# Patient Record
Sex: Female | Born: 1955 | Race: White | Hispanic: No | Marital: Single | Smoking: Former smoker
Health system: Southern US, Community
[De-identification: ages and names within clinical notes are randomized; demographics above are authoritative.]

## PROBLEM LIST (undated history)

## (undated) DIAGNOSIS — F329 Major depressive disorder, single episode, unspecified: Secondary | ICD-10-CM

## (undated) DIAGNOSIS — E119 Type 2 diabetes mellitus without complications: Secondary | ICD-10-CM

## (undated) DIAGNOSIS — F419 Anxiety disorder, unspecified: Secondary | ICD-10-CM

## (undated) DIAGNOSIS — H269 Unspecified cataract: Secondary | ICD-10-CM

## (undated) DIAGNOSIS — M1990 Unspecified osteoarthritis, unspecified site: Secondary | ICD-10-CM

## (undated) DIAGNOSIS — J45909 Unspecified asthma, uncomplicated: Secondary | ICD-10-CM

## (undated) DIAGNOSIS — J302 Other seasonal allergic rhinitis: Secondary | ICD-10-CM

## (undated) DIAGNOSIS — E785 Hyperlipidemia, unspecified: Secondary | ICD-10-CM

## (undated) DIAGNOSIS — F32A Depression, unspecified: Secondary | ICD-10-CM

## (undated) HISTORY — DX: Major depressive disorder, single episode, unspecified: F32.9

## (undated) HISTORY — DX: Depression, unspecified: F32.A

## (undated) HISTORY — DX: Unspecified osteoarthritis, unspecified site: M19.90

## (undated) HISTORY — PX: CATARACT EXTRACTION, BILATERAL: SHX1313

## (undated) HISTORY — DX: Other seasonal allergic rhinitis: J30.2

## (undated) HISTORY — DX: Anxiety disorder, unspecified: F41.9

## (undated) HISTORY — DX: Unspecified asthma, uncomplicated: J45.909

## (undated) HISTORY — DX: Unspecified cataract: H26.9

## (undated) HISTORY — DX: Hyperlipidemia, unspecified: E78.5

## (undated) HISTORY — DX: Type 2 diabetes mellitus without complications: E11.9

---

## 1989-02-12 HISTORY — PX: OTHER SURGICAL HISTORY: SHX169

## 1997-09-01 ENCOUNTER — Other Ambulatory Visit: Admission: RE | Admit: 1997-09-01 | Discharge: 1997-09-01 | Payer: Self-pay | Admitting: Obstetrics and Gynecology

## 2001-04-10 ENCOUNTER — Other Ambulatory Visit: Admission: RE | Admit: 2001-04-10 | Discharge: 2001-04-10 | Payer: Self-pay | Admitting: Obstetrics and Gynecology

## 2002-05-13 ENCOUNTER — Other Ambulatory Visit: Admission: RE | Admit: 2002-05-13 | Discharge: 2002-05-13 | Payer: Self-pay | Admitting: Obstetrics and Gynecology

## 2003-05-27 ENCOUNTER — Other Ambulatory Visit: Admission: RE | Admit: 2003-05-27 | Discharge: 2003-05-27 | Payer: Self-pay | Admitting: Obstetrics and Gynecology

## 2004-09-14 ENCOUNTER — Other Ambulatory Visit: Admission: RE | Admit: 2004-09-14 | Discharge: 2004-09-14 | Payer: Self-pay | Admitting: Obstetrics and Gynecology

## 2005-12-26 ENCOUNTER — Ambulatory Visit: Payer: Self-pay | Admitting: Internal Medicine

## 2006-02-13 ENCOUNTER — Ambulatory Visit: Payer: Self-pay | Admitting: Internal Medicine

## 2007-02-13 HISTORY — PX: BLEPHAROPLASTY: SUR158

## 2010-02-23 ENCOUNTER — Encounter: Payer: Self-pay | Admitting: Cardiology

## 2010-03-03 DIAGNOSIS — I10 Essential (primary) hypertension: Secondary | ICD-10-CM | POA: Insufficient documentation

## 2010-03-03 DIAGNOSIS — M949 Disorder of cartilage, unspecified: Secondary | ICD-10-CM

## 2010-03-03 DIAGNOSIS — R87619 Unspecified abnormal cytological findings in specimens from cervix uteri: Secondary | ICD-10-CM | POA: Insufficient documentation

## 2010-03-03 DIAGNOSIS — R5381 Other malaise: Secondary | ICD-10-CM | POA: Insufficient documentation

## 2010-03-03 DIAGNOSIS — E119 Type 2 diabetes mellitus without complications: Secondary | ICD-10-CM | POA: Insufficient documentation

## 2010-03-03 DIAGNOSIS — E7800 Pure hypercholesterolemia, unspecified: Secondary | ICD-10-CM | POA: Insufficient documentation

## 2010-03-03 DIAGNOSIS — R5383 Other fatigue: Secondary | ICD-10-CM

## 2010-03-03 DIAGNOSIS — N951 Menopausal and female climacteric states: Secondary | ICD-10-CM | POA: Insufficient documentation

## 2010-03-03 DIAGNOSIS — M899 Disorder of bone, unspecified: Secondary | ICD-10-CM | POA: Insufficient documentation

## 2010-03-06 ENCOUNTER — Encounter: Payer: Self-pay | Admitting: Cardiology

## 2010-03-06 ENCOUNTER — Ambulatory Visit
Admission: RE | Admit: 2010-03-06 | Discharge: 2010-03-06 | Payer: Self-pay | Source: Home / Self Care | Attending: Cardiology | Admitting: Cardiology

## 2010-03-16 NOTE — Assessment & Plan Note (Signed)
Summary: np4/fam hx of heart dx/htn/lg   Visit Type:  Initial Consult Primary Provider:  Dr. Leandrew Koyanagi [facility] in [place]  CC:  light headed periodically. no other complaints today .  History of Present Illness: Mrs Bradstreet is a delightful 55 year old married white female who comes today for cardiac risk factor assessment.  She has a family history of coronary disease with father having a heart attack at age 59 and dying. He was a smoker and had I. cholesterol. In addition she smoked up until 1985 starting as a teenager.  She was diagnosed with diabetes 3 years ago. Her hemoglobin A1c during runs in the sixes.  She has hyperlipidemia and is currently on 5 mg of Crestor. She says her LDL is not below 100 but I do not have any values. She says her HDL is rather good.  There is no history of hypertension.  Current Medications (verified): 1)  Lovaza 1 Gm Caps (Omega-3-Acid Ethyl Esters) .... 4 Caps Once Daily 2)  Lisinopril 5 Mg Tabs (Lisinopril) .Marland Kitchen.. 1 Tab Once Daily 3)  Celexa 20 Mg Tabs (Citalopram Hydrobromide) .Marland Kitchen.. 1 Tab Once Daily 4)  Ambien 10 Mg Tabs (Zolpidem Tartrate) .Marland Kitchen.. 1 Tab At Bedtime 5)  Crestor 5 Mg Tabs (Rosuvastatin Calcium) .Marland Kitchen.. 1 Tab At Bedtime 6)  Cod Liver Oil  Caps (Cod Liver Oil) .... 2 Caps Once Daily 7)  Metformin Hcl 500 Mg Tabs (Metformin Hcl) .Marland Kitchen.. 1 Tab Two Times A Day 8)  Allegra Allergy 180 Mg Tabs (Fexofenadine Hcl) .Marland Kitchen.. 1 Tab Once Daily 9)  Aspirin 81 Mg Tbec (Aspirin) .... Take One Tablet By Mouth Daily 10)  Calcium-Vitamin D 600-200 Mg-Unit Tabs (Calcium-Vitamin D) .... Two Times A Day  Allergies (verified): No Known Drug Allergies  Past History:  Past Medical History: Last updated: 03/03/2010 CORONARY ARTERY DISEASE, FAMILY HX (ICD-V17.3) HYPERTENSION (ICD-401.9) HYPERCHOLESTEROLEMIA (ICD-272.0) FATIGUE (ICD-780.79) DM (ICD-250.00) MENOPAUSE-RELATED VASOMOTOR SYMPTOMS, HOT FLASHES (ICD-627.2) DIABETES MELLITUS, TYPE II  (ICD-250.00) PAP SMEAR, ABNORMAL (ICD-795.00) OSTEOPENIA (ICD-733.90)    Past Surgical History: Last updated: 03/03/2010 Back Surgery...1991 Cataract surgery...right eye...2009 Colposcopy...1996? Blepharoplasty.Marland KitchenMarland Kitchen2010  Family History: Last updated: 03/03/2010 Family History of Coronary Artery Disease:  Family History of Diabetes:   Social History: Last updated: 03/03/2010 Tobacco Use - No.  Alcohol Use - yes...2-3 drinks on the weekend (wine) Drug Use - no Married   Risk Factors: Smoking Status: never (03/03/2010)  Review of Systems       negative other than history of present illness  Vital Signs:  Patient profile:   55 year old female Height:      66 inches Weight:      149.50 pounds BMI:     24.22 Pulse rate:   65 / minute Pulse rhythm:   regular BP sitting:   90 / 72  (left arm) Cuff size:   large  Vitals Entered By: Danielle Rankin, CMA (March 06, 2010 4:14 PM)  Physical Exam  General:  Well developed, well nourished, in no acute distress. Head:  normocephalic and atraumatic Eyes:  PERRLA/EOM intact; conjunctiva and lids normal. Neck:  Neck supple, no JVD. No masses, thyromegaly or abnormal cervical nodes. Chest Kailany Dinunzio:  no deformities or breast masses noted Lungs:  Clear bilaterally to auscultation and percussion. Heart:  PMI not displaced, normal S1-S2, no murmur rub or gallop. No carotid bruits Abdomen:  cough, good bowel sounds, no bruit Msk:  Back normal, normal gait. Muscle strength and tone normal. Pulses:  pulses normal in all 4 extremities  Extremities:  No clubbing or cyanosis. Neurologic:  Alert and oriented x 3. Skin:  Intact without lesions or rashes. Psych:  Normal affect.   Impression & Recommendations:  Problem # 1:  HYPERCHOLESTEROLEMIA (ICD-272.0) I advised her to increase her Crestor alone with her primary care to achieve a values close to 70 for her LDL. Her updated medication list for this problem includes:    Lovaza 1 Gm Caps  (Omega-3-acid ethyl esters) .Marland KitchenMarland KitchenMarland KitchenMarland Kitchen 4 caps once daily    Crestor 5 Mg Tabs (Rosuvastatin calcium) .Marland Kitchen... 1 tab at bedtime  Problem # 2:  DM (ICD-250.00) continued excellent control Her updated medication list for this problem includes:    Lisinopril 5 Mg Tabs (Lisinopril) .Marland Kitchen... 1 tab once daily    Metformin Hcl 500 Mg Tabs (Metformin hcl) .Marland Kitchen... 1 tab two times a day    Aspirin 81 Mg Tbec (Aspirin) .Marland Kitchen... Take one tablet by mouth daily  Problem # 3:  CORONARY ARTERY DISEASE, FAMILY HX (ICD-V17.3) Assessment: Unchanged  Other Orders: EKG w/ Interpretation (93000)  Patient Instructions: 1)  Your physician recommends that you schedule a follow-up appointment as needed. 2)  Dr. Daleen Squibb recommends that you have your primary care doctor increase Crestor to get LDL as close to 70 as possible. 3)  Please walk 3 hours per week for exercise.:

## 2010-04-11 NOTE — Letter (Signed)
Summary: [facility]  [facility]   Imported By: Marylou Mccoy 04/05/2010 14:28:08  _____________________________________________________________________  External Attachment:    Type:   Image     Comment:   External Document

## 2010-06-30 NOTE — Assessment & Plan Note (Signed)
[facility]                         GASTROENTEROLOGY OFFICE NOTE   ELANORA, QUIN                     MRN:          [id number]  DATE:02/13/2006                            DOB:          1955-02-14    Ms. Hattery returns for followup of anal fissure.  Since her appointment  of December 26, 2005, her rectal bleeding stopped as a result of using  Anusol HC suppositories for almost 2 weeks on a daily basis combined  with AnaMantle cream 2.5% twice or three times per day.  She has done  very well and seems to be satisfied with the progress.   Today I have given her a new prescription for Anusol HC suppositories  which she feels helps more than the cream to be used as a maintenance.  In case the anal fissure recurs I would suggest surgical consultation  for repair of the anal fissure.  She will let me know if that is the  case.     Kendra [name]. Juanda Chance, MD  Electronically Signed    DMB/MedQ  DD: 02/13/2006  DT: 02/13/2006  Job #: [id number]   cc:   Juluis Mire, M.D.

## 2010-06-30 NOTE — Assessment & Plan Note (Signed)
[facility]                           GASTROENTEROLOGY OFFICE NOTE   Kendra [name], Kendra [name]                     MRN:          [id number]  DATE:12/26/2005                            DOB:          10-29-1955    Kendra [name] is a 55 year old white female who is here today because of acute  hematochezia.  This was occurring every day for about 3 weeks.  We have sent  the patient topical steroids and Anusol-HC suppositories.  The bleeding  subsided but returned again 2 days ago.  There was a large amount of blood  per rectum in the toilet.  We did a colonoscopy on August 12, 2003, because of  a family history of colon polyps.  Exam was normal except for external  hemorrhoids.  She said she has had hemorrhoids for many years.   PHYSICAL EXAMINATION:  VITAL SIGNS:  Blood pressure 104/70, pulse 92, and  weight 153 pounds.  ABDOMEN:  Unremarkable.  CHEST:  Clear to auscultation.  CARDIAC:  Normal S1 and S2.  RECTAL:  Rectal exam and anoscopic exam shows external hemorrhoidal tag.  Anal canal was normal.  There was a 5 mm long x 3 mm wide anal fissure at  the junction of the dentate line.  There was prominent papilla at the  dentate line which was polypoid and might have represented a thrombosed  scarred internal hemorrhoid.  The anal fissure was bleeding as the anoscope  advanced through the anal canal.  The fissure was oozing a small amount of  bright red blood.  Stool itself in the rectal ampulla was heme-negative.   IMPRESSION:  Anal fissure, bleeding.   PLAN:  Resume conservative measures with AnaMantle Cream 2.5% twice or three  times a day, Anusol-HC suppositories at bedtime.  Refrain from straining.  Take a high-fiber diet.  I will see her again in 4 weeks.  If symptoms  continue, she will need surgical consult for repair of anal fissure.  We are  going to check a CBC today.     Kendra [name]. Kendra Chance, MD  Electronically Signed    DMB/MedQ  DD:  12/26/2005  DT: 12/26/2005  Job #: [id number]   cc:   Kendra [name], M.D.

## 2013-05-06 ENCOUNTER — Encounter: Payer: Self-pay | Admitting: Internal Medicine

## 2013-10-14 ENCOUNTER — Encounter: Payer: Self-pay | Admitting: Internal Medicine

## 2013-12-14 ENCOUNTER — Encounter: Payer: Self-pay | Admitting: Internal Medicine

## 2013-12-17 ENCOUNTER — Ambulatory Visit (AMBULATORY_SURGERY_CENTER): Payer: Self-pay | Admitting: *Deleted

## 2013-12-17 VITALS — Ht 65.5 in | Wt 150.4 lb

## 2013-12-17 DIAGNOSIS — Z1211 Encounter for screening for malignant neoplasm of colon: Secondary | ICD-10-CM

## 2013-12-17 MED ORDER — MOVIPREP 100 G PO SOLR: 1.0000 | Freq: Once | ORAL | Status: DC

## 2013-12-17 NOTE — Progress Notes (Signed)
Denies allergies to eggs or soy products. Denies complications with sedation or anesthesia. Denies O2 use. Denies use of diet or weight loss medications.  Emmi instructions given for colonoscopy.  

## 2013-12-31 ENCOUNTER — Ambulatory Visit (AMBULATORY_SURGERY_CENTER): Payer: Managed Care, Other (non HMO) | Admitting: Internal Medicine

## 2013-12-31 ENCOUNTER — Encounter: Payer: Self-pay | Admitting: Internal Medicine

## 2013-12-31 VITALS — BP 101/71 | HR 58 | Temp 97.2°F | Resp 14 | Ht 65.5 in | Wt 150.0 lb

## 2013-12-31 DIAGNOSIS — Z1211 Encounter for screening for malignant neoplasm of colon: Secondary | ICD-10-CM

## 2013-12-31 MED ORDER — SODIUM CHLORIDE 0.9 % IV SOLN: 500.0000 mL | INTRAVENOUS | Status: DC

## 2013-12-31 NOTE — Op Note (Signed)
[facility] [address] [place], [id number]   COLONOSCOPY PROCEDURE REPORT  PATIENT: Kendra [name], Kendra S  MR#: [id number] BIRTHDATE: [birth year] , 58  yrs. old GENDER: female ENDOSCOPIST: Hart Carwinora M Irisa Grimsley, MD REFERRED BY: Dr Quintin Altosteven Burdine PROCEDURE DATE:  12/31/2013 PROCEDURE:   Colonoscopy, screening First Screening Colonoscopy - Avg.  risk and is 50 yrs.  old or older - No.  Prior Negative Screening - Now for repeat screening. 10 or more years since last screening  History of Adenoma - Now for follow-up colonoscopy & has been > or = to 3 yrs.  N/A  Polyps Removed Today? No.  Polyps Removed Today? No.  Recommend repeat exam, <10 yrs? Polyps Removed Today? No.  Recommend repeat exam, <10 yrs? No. ASA CLASS:   Class I INDICATIONS:average risk for colon cancer and prior colonoscopy in 07/2003 was normal. family hx of colon polyps MEDICATIONS: Monitored anesthesia care and Propofol 400 mg IV  DESCRIPTION OF PROCEDURE:   After the risks benefits and alternatives of the procedure were thoroughly explained, informed consent was obtained.  The digital rectal exam revealed no abnormalities of the rectum.   The LB [id number]-[id number]  endoscope was introduced through the anus and advanced to the cecum, which was identified by the ileocecal valve. No adverse events experienced.   The quality of the prep was excellent, using MoviPrep  The instrument was then slowly withdrawn as the colon was fully examined.      COLON FINDINGS: Small internal Grade I hemorrhoids were found. Retroflexed views revealed no abnormalities. The time to cecum=26 minutes 46 seconds.  Withdrawal time=6 minutes 02 seconds.  The scope was withdrawn and the procedure completed. COMPLICATIONS: There were no immediate complications.  ENDOSCOPIC IMPRESSION: Small internal Grade I hemorrhoids  RECOMMENDATIONS: High fiber diet recall colonoscopy in 10 years  eSigned:  Hart Carwinora M Golda Zavalza, MD 12/31/2013  10:09 AM   cc:

## 2013-12-31 NOTE — Progress Notes (Signed)
Report to PACU, RN, vss, BBS= Clear.  

## 2013-12-31 NOTE — Patient Instructions (Addendum)

## 2014-01-01 ENCOUNTER — Telehealth: Payer: Self-pay

## 2014-01-01 NOTE — Telephone Encounter (Signed)
  Follow up Call-  Call back number 12/31/2013  Post procedure Call Back phone  # [phone]  Permission to leave phone message Yes     Patient questions:  Do you have a fever, pain , or abdominal swelling? No. Pain Score  0 *  Have you tolerated food without any problems? Yes.    Have you been able to return to your normal activities? Yes.    Do you have any questions about your discharge instructions: Diet   No. Medications  No. Follow up visit  No.  Do you have questions or concerns about your Care? No.  Actions: * If pain score is 4 or above: No action needed, pain <4.

## 2016-04-26 ENCOUNTER — Other Ambulatory Visit: Payer: Self-pay | Admitting: Obstetrics and Gynecology

## 2016-04-26 DIAGNOSIS — M79622 Pain in left upper arm: Secondary | ICD-10-CM

## 2016-05-01 ENCOUNTER — Ambulatory Visit
Admission: RE | Admit: 2016-05-01 | Discharge: 2016-05-01 | Disposition: A | Discharge status: Home / Self Care | Payer: Managed Care, Other (non HMO) | Source: Ambulatory Visit | Attending: Obstetrics and Gynecology | Admitting: Obstetrics and Gynecology

## 2016-05-01 DIAGNOSIS — M79622 Pain in left upper arm: Secondary | ICD-10-CM

## 2016-12-11 DIAGNOSIS — F411 Generalized anxiety disorder: Secondary | ICD-10-CM | POA: Diagnosis not present

## 2016-12-11 DIAGNOSIS — E782 Mixed hyperlipidemia: Secondary | ICD-10-CM | POA: Diagnosis not present

## 2016-12-11 DIAGNOSIS — E783 Hyperchylomicronemia: Secondary | ICD-10-CM | POA: Diagnosis not present

## 2016-12-11 DIAGNOSIS — E119 Type 2 diabetes mellitus without complications: Secondary | ICD-10-CM | POA: Diagnosis not present

## 2016-12-20 DIAGNOSIS — Z23 Encounter for immunization: Secondary | ICD-10-CM | POA: Diagnosis not present

## 2016-12-20 DIAGNOSIS — Z0001 Encounter for general adult medical examination with abnormal findings: Secondary | ICD-10-CM | POA: Diagnosis not present

## 2016-12-20 DIAGNOSIS — Z6823 Body mass index (BMI) 23.0-23.9, adult: Secondary | ICD-10-CM | POA: Diagnosis not present

## 2017-02-12 HISTORY — PX: BACK SURGERY: SHX140

## 2017-04-04 DIAGNOSIS — M545 Low back pain: Secondary | ICD-10-CM | POA: Diagnosis not present

## 2017-04-04 DIAGNOSIS — M25551 Pain in right hip: Secondary | ICD-10-CM | POA: Diagnosis not present

## 2017-04-07 ENCOUNTER — Emergency Department (HOSPITAL_COMMUNITY): Payer: BLUE CROSS/BLUE SHIELD

## 2017-04-07 ENCOUNTER — Emergency Department (HOSPITAL_COMMUNITY)
Admission: EM | Admit: 2017-04-07 | Discharge: 2017-04-08 | Disposition: A | Discharge status: Home / Self Care | Payer: BLUE CROSS/BLUE SHIELD | Attending: Emergency Medicine | Admitting: Emergency Medicine

## 2017-04-07 DIAGNOSIS — M25551 Pain in right hip: Secondary | ICD-10-CM

## 2017-04-07 DIAGNOSIS — I10 Essential (primary) hypertension: Secondary | ICD-10-CM | POA: Diagnosis not present

## 2017-04-07 DIAGNOSIS — Z79899 Other long term (current) drug therapy: Secondary | ICD-10-CM | POA: Insufficient documentation

## 2017-04-07 DIAGNOSIS — M5416 Radiculopathy, lumbar region: Secondary | ICD-10-CM | POA: Insufficient documentation

## 2017-04-07 DIAGNOSIS — Z87891 Personal history of nicotine dependence: Secondary | ICD-10-CM | POA: Diagnosis not present

## 2017-04-07 DIAGNOSIS — M545 Low back pain, unspecified: Secondary | ICD-10-CM

## 2017-04-07 DIAGNOSIS — J45909 Unspecified asthma, uncomplicated: Secondary | ICD-10-CM | POA: Insufficient documentation

## 2017-04-07 DIAGNOSIS — M549 Dorsalgia, unspecified: Secondary | ICD-10-CM | POA: Diagnosis not present

## 2017-04-07 DIAGNOSIS — M79604 Pain in right leg: Secondary | ICD-10-CM | POA: Diagnosis not present

## 2017-04-07 DIAGNOSIS — E119 Type 2 diabetes mellitus without complications: Secondary | ICD-10-CM | POA: Diagnosis not present

## 2017-04-07 MED ORDER — NAPROXEN 500 MG PO TABS: 500.0000 mg | ORAL_TABLET | Freq: Three times a day (TID) | ORAL | 0 refills | Status: AC

## 2017-04-07 MED ORDER — ACETAMINOPHEN 500 MG PO TABS
1000.0000 mg | ORAL_TABLET | Freq: Once | ORAL | Status: AC
  Administered 2017-04-07: 1000 mg via ORAL
  Filled 2017-04-07: qty 2

## 2017-04-07 MED ORDER — OXYCODONE-ACETAMINOPHEN 5-325 MG PO TABS: 2.0000 | ORAL_TABLET | ORAL | 0 refills | Status: AC | PRN

## 2017-04-07 MED ORDER — OXYCODONE-ACETAMINOPHEN 5-325 MG PO TABS
1.0000 | ORAL_TABLET | ORAL | Status: DC | PRN
  Administered 2017-04-07: 1 via ORAL
  Filled 2017-04-07: qty 1

## 2017-04-07 MED ORDER — NAPROXEN 250 MG PO TABS
500.0000 mg | ORAL_TABLET | Freq: Once | ORAL | Status: AC
  Administered 2017-04-07: 500 mg via ORAL
  Filled 2017-04-07: qty 2

## 2017-04-07 MED ORDER — HYDROMORPHONE HCL 1 MG/ML IJ SOLN
1.0000 mg | Freq: Once | INTRAMUSCULAR | Status: AC
  Administered 2017-04-07: 1 mg via INTRAMUSCULAR
  Filled 2017-04-07: qty 1

## 2017-04-07 NOTE — ED Provider Notes (Addendum)
Patient care signed out to me from Sharen Hecklaudia Gibbons, PA-C, at end of shift. She is here for evaluation of right hip pain for the past 6 days without known injury. She was seen by her doctor 4 days ago and reports negative plain film imaging. Pain continues. She continues to be weight bearing and active but with pain. No fever.   Patient signed out with CT of the hip pending. Pain is addressed in the ED.   Plan: review CT of hip. If negative, she can be discharged home with pain control. ? Ortho follow up.   CT is negative for fracture or effusion. On re-evaluation she reports severe pain in anterior thigh and knee. Mild pain in hip. Little relief with Percocet given here.   Further discussion with the patient finds that she has had previous lumbar surgery remotely. She denies severe back pain but reports some. No urinary/bowel dysfunction, numbness or weakness of the right LE. She feels walking is extremely difficult due to pain, even with cane. Pain seems radicular following L3 pattern.   She reports she is uncomfortable with discharge home. Due to severity of pain will obtain MRI of lumbar spine. Discussed that unless there was a neurologic emergency identified, the plan would be for outpatient follow up. Additional pain management ordered.   Patient's MRI delayed due to multiple MRI orders in the department causing increased wait time. Patient care signed out to Kendra Drapeob Browning, PA-C pending MRI results. Anticipate discharge home.    Kendra [name], Kendra Uhlig, PA-C 04/07/17 1709      Kendra [name], Kendra Cappello, PA-C 04/08/17 Kendra [name]    Kendra [name], Kevin, MD 04/12/17 2235

## 2017-04-07 NOTE — ED Notes (Signed)
Advised pt MRI said it wouldn't be before 1am

## 2017-04-07 NOTE — ED Notes (Addendum)
Pt stable, ambulatory with difficulty, states understanding of discharge instructions but requesting to stay for MRI

## 2017-04-07 NOTE — Discharge Instructions (Addendum)
CT scan negative.   Take 500 mg tylenol plus 500 mg naproxen every 6 hours for mild or moderate pain. You may take 1 percocet for more severe break through pain. Follow up with your primary care doctor as soon as possible for pain control and referrals.   Percocet is a narcotic pain medication that has risk of overdose, death, dependence and abuse. Mild and expected side effects include nausea, stomach upset, drowsiness, constipation. Do not consume alcohol, drive or use heavy machinery while taking this medication. Do not leave unattended around children. Flush any remaining pills that you do not use and do not share.  The emergency department has a strict policy regarding prescription of narcotic medications. We prescribe a short course for acute, new pain or injuries. We are unable to refill this medication in the emergency department for chronic pain or repeatedly.  Refill need to be done by specialist or primary care provider or pain clinic.  Contact your primary care provider or specialist for chronic pain management and refill on narcotic medications.

## 2017-04-07 NOTE — ED Provider Notes (Addendum)
[facility] EMERGENCY DEPARTMENT Provider Note   CSN: [id number] Arrival date & time: 04/07/17  1335     History   Chief Complaint Chief Complaint  Patient presents with  . Hip Pain    HPI Kendra [name] is a 62 y.o. female Is here for evaluation of gradually worsening pain to the middle and right side of her back as well as the front of her right leg down to the knee for 6 days. Describes pain as a pulling sensation that is worse with weight bearing. States she had a bad fall 2 months ago while she was walking in the snow, she fellon the cement curb and landed directly on her right hip. She was sore for a few days but eventually this improved. Since she has been very active without any difficulty. Pain was sudden, gradually worsening. It is described as a 10/10 with weightbearing. Went to see her primary care doctor for this 3 days ago who did x-rays of her hip and back that she was told was normal however was recommended she got an MRI to rule out a small fracture. States her insurance has been giving her problems to schedule this procedure and she has also been unable to obtain her tramadol. Has been taking her husband's tramadol and naproxen that have been helping. States she had a bad back injury in her 30s and had a back surgery, since then she has had decreased sensation to the posterior aspect of her right leg, this is not new and unchanged. She denies fevers, chills, nausea, vomiting, abdominal pain, urinary symptoms, hematuria, history of kidney stones, saddle anesthesia, bladder or bowel incontinence or retention.  HPI  Past Medical History:  Diagnosis Date  . Anxiety   . Arthritis   . Asthma   . Cataracts, bilateral   . Depression   . Diabetes type 2, controlled (HCC)   . Hyperlipidemia   . Seasonal allergies     Patient Active Problem List   Diagnosis Date Noted  . Type II or unspecified type diabetes mellitus without mention of complication, not  stated as uncontrolled 03/03/2010  . HYPERCHOLESTEROLEMIA 03/03/2010  . HYPERTENSION 03/03/2010  . MENOPAUSE-RELATED VASOMOTOR SYMPTOMS, HOT FLASHES 03/03/2010  . OSTEOPENIA 03/03/2010  . FATIGUE 03/03/2010  . PAP SMEAR, ABNORMAL 03/03/2010    Past Surgical History:  Procedure Laterality Date  . BLEPHAROPLASTY  2009  . CATARACT EXTRACTION, BILATERAL  2011, 2013  . lumbar disc removal  1991    OB History    No data available       Home Medications    Prior to Admission medications   Medication Sig Start Date End Date Taking? Authorizing Provider  Ascorbic Acid (VITAMIN C) 500 MG CAPS Take by mouth.    [provider]  calcium-vitamin D (OSCAL) 250-125 MG-UNIT per tablet Take by mouth.    [provider]  Coenzyme Q10 (CO Q 10 PO) Take by mouth.    [provider]  fexofenadine (ALLEGRA) 180 MG tablet Take 180 mg by mouth.    [provider]  gemfibrozil (LOPID) 600 MG tablet Take 600 mg by mouth 2 (two) times daily before a meal.    [provider]  lisinopril (PRINIVIL,ZESTRIL) 5 MG tablet Take 5 mg by mouth.    [provider]  metFORMIN (GLUCOPHAGE) 500 MG tablet Take 500 mg by mouth.    [provider]  naproxen (NAPROSYN) 500 MG tablet Take 1 tablet (500 mg total)  by mouth 3 (three) times daily with meals for 5 days. 04/07/17 04/12/17  Liberty Handy, PA-C  omega-3 acid ethyl esters (LOVAZA) 1 G capsule Take 2 g by mouth.    [provider]  oxyCODONE-acetaminophen (PERCOCET/ROXICET) 5-325 MG tablet Take 2 tablets by mouth every 4 (four) hours as needed for up to 3 days for severe pain. 04/07/17 04/10/17  Liberty Handy, PA-C  rosuvastatin (CRESTOR) 10 MG tablet Take 10 mg by mouth.    [provider]  sertraline (ZOLOFT) 100 MG tablet Take 100 mg by mouth daily.    [provider]  vitamin B-12 (CYANOCOBALAMIN) 1000 MCG tablet Take 1,000 mcg by mouth.    [provider]    zolpidem (AMBIEN) 10 MG tablet Take 10 mg by mouth at bedtime as needed for sleep.    [provider]    Family History Family History  Problem Relation Age of Onset  . Breast cancer Paternal Grandmother   . Colon cancer Neg Hx   . Esophageal cancer Neg Hx   . Rectal cancer Neg Hx   . Prostate cancer Neg Hx     Social History Social History   Tobacco Use  . Smoking status: Former Smoker    Last attempt to quit: 02/12/1981    Years since quitting: 36.1  . Smokeless tobacco: Never Used  Substance Use Topics  . Alcohol use: Yes    Alcohol/week: 0.0 oz    Comment: rare  . Drug use: No     Allergies   Patient has no known allergies.   Review of Systems Review of Systems  Musculoskeletal: Positive for arthralgias and back pain.     Physical Exam Updated Vital Signs BP 109/70 (BP Location: Right Arm)   Pulse 70   Temp 99 F (37.2 C) (Oral)   Resp 16   Ht 5\' 5"  (1.651 m)   Wt 64.9 kg (143 lb)   SpO2 100%   BMI 23.80 kg/m   Physical Exam  Constitutional: She is oriented to person, place, and time. She appears well-developed and well-nourished. No distress.  HENT:  Head: Normocephalic and atraumatic.  Nose: Nose normal.  Eyes: EOM are normal.  Neck:  Full AROM of cervical spine without pain or rigidity  Cardiovascular: Normal rate, S1 normal, S2 normal and normal heart sounds.  Pulses:      Radial pulses are 2+ on the right side, and 2+ on the left side.       Dorsalis pedis pulses are 2+ on the right side, and 2+ on the left side.  Pulmonary/Chest: Effort normal and breath sounds normal. She has no decreased breath sounds.  Abdominal: Soft. Normal appearance and bowel sounds are normal. There is no tenderness.  No suprapubic or CVA tenderness   Musculoskeletal:       Lumbar back: She exhibits pain.  No midline TL or sacral tenderness or step offs. Old midline lumbar spine scar well healed.  No TL spine paraspinal muscular tenderness or increased  tone  SI joints and sciatic notches non tender, bilaterally  Negative SLR. Negative Faber. Negative Stinchfield test. No leg shortening or rotation. Pelvis stable.  +Right hip pain with weight bearing and deep internal rotation   Neurological: She is alert and oriented to person, place, and time. A sensory deficit is present.  5/5 strength with flexion/extension of hip, knee and ankle, bilaterally.  Decreased sensation to RLE that pt states is chronic.  Knee DTRs symmetric. Normal plantar  reflex.   Skin: Skin is warm and dry. Capillary refill takes less than 2 seconds.  Psychiatric: She has a normal mood and affect. Her speech is normal and behavior is normal. Judgment and thought content normal. Cognition and memory are normal.  Nursing note and vitals reviewed.    ED Treatments / Results  Labs (all labs ordered are listed, but only abnormal results are displayed) Labs Reviewed - No data to display  EKG  EKG Interpretation None       Radiology No results found.  Procedures Procedures (including critical care time)  Medications Ordered in ED Medications  oxyCODONE-acetaminophen (PERCOCET/ROXICET) 5-325 MG per tablet 1 tablet (1 tablet Oral Given 04/07/17 1401)  acetaminophen (TYLENOL) tablet 1,000 mg (1,000 mg Oral Given 04/07/17 1528)  naproxen (NAPROSYN) tablet 500 mg (500 mg Oral Given 04/07/17 1528)     Initial Impression / Assessment and Plan / ED Course  I have reviewed the triage vital signs and the nursing notes.  Pertinent labs & imaging results that were available during my care of the patient were reviewed by me and considered in my medical decision making (see chart for details).    62 yo here with right anterior leg pain, right buttock pain and low back pain. Atraumatic. One fall 2 months ago. No danger signs of back pain. She has chronic right leg numbness from surgery in her 30s for back injury, not new or worsening. Given age, risk, remote h/o of fall will  get CT to r/o smaller fracture. She had normal x-rays at PCP 3 days ago. Pain controlled with oral medications. Will hand off to oncoming EDP who will f/u on CT results. If negative, pt to be discharged with percocet and f/u with PCP for MRI and ortho referral. Pt discussed with Dr. Denton LankSteinl. Plan discussed with pt and family at bedside they are agreeable.   Final Clinical Impressions(s) / ED Diagnoses   Final diagnoses:  Pain of right hip joint  Acute right-sided low back pain without sciatica    ED Discharge Orders        Ordered    oxyCODONE-acetaminophen (PERCOCET/ROXICET) 5-325 MG tablet  Every 4 hours PRN     04/07/17 1549    naproxen (NAPROSYN) 500 MG tablet  3 times daily with meals     04/07/17 1549         Jerrell MylarGibbons, Clora Ohmer J, PA-C 04/07/17 1555    Cathren LaineSteinl, Kevin, MD 04/12/17 2235

## 2017-04-07 NOTE — ED Notes (Signed)
This RN not aware that pt was roomed to hallway bed and was waiting for transport

## 2017-04-07 NOTE — ED Triage Notes (Signed)
Pt to ER for further evaluation of right anterior leg and right hip pain, pt reports significant back hx, began having this pain Monday, was seen by her doctor Thursday and had films done. Was told she needs an MRI, was given pain med scripts but unable to fill them due to insurance issue. Pt here for pain control.

## 2017-04-08 ENCOUNTER — Emergency Department (HOSPITAL_COMMUNITY): Payer: BLUE CROSS/BLUE SHIELD

## 2017-04-08 DIAGNOSIS — M79604 Pain in right leg: Secondary | ICD-10-CM | POA: Diagnosis not present

## 2017-04-08 MED ORDER — CYCLOBENZAPRINE HCL 10 MG PO TABS: 10.0000 mg | ORAL_TABLET | Freq: Two times a day (BID) | ORAL | 0 refills | Status: DC | PRN

## 2017-04-08 MED ORDER — HYDROMORPHONE HCL 1 MG/ML IJ SOLN
1.0000 mg | Freq: Once | INTRAMUSCULAR | Status: AC
  Administered 2017-04-08: 1 mg via INTRAMUSCULAR
  Filled 2017-04-08: qty 1

## 2017-04-08 NOTE — ED Provider Notes (Signed)
Pain improved.  MRI as below.  Disc protrusion probable cause of pain.  Neurosurg follow-up.  No results found for this or any previous visit. Ct Lumbar Spine Wo Contrast  Result Date: 04/07/2017 CLINICAL DATA:  Back pain and right leg pain. EXAM: CT LUMBAR SPINE WITHOUT CONTRAST TECHNIQUE: Multidetector CT imaging of the lumbar spine was performed without intravenous contrast administration. Multiplanar CT image reconstructions were also generated. COMPARISON:  None. FINDINGS: Segmentation: The lowest lumbar type non-rib-bearing vertebra is labeled as L5. Alignment: 2 mm grade 1 degenerative anterolisthesis at L3-4. Vertebrae: No fracture or acute bony findings. Moderate to prominent loss of articular space at L5-S1 with mild endplate irregularity and vacuum disc phenomenon in addition to intervertebral spurring. Multilevel facet arthropathy but most notable at the L3-4 level bilaterally. Paraspinal and other soft tissues: Aortoiliac atherosclerotic vascular disease. Disc levels: T11-12: No impingement, central intervertebral spur noted, image 30/8. T12-L1: Unremarkable. L1-2: Unremarkable L2-3: Mild central narrowing of the thecal sac and borderline bilateral foraminal stenosis due to disc bulge. L3-4: I am suspicious of considerable right foraminal impingement probably due to a disc protrusion given the high density and effacement of fat in the right neural foramen. There is likely mild and probably moderate central narrowing of the thecal sac at this level due to disc bulge. L4-5: Moderate central narrowing of the thecal sac and suspected mild bilateral foraminal stenosis due to disc bulge and facet arthropathy. L5-S1: Suspected moderate left and mild right foraminal stenosis due to intervertebral and facet spurring at this level. IMPRESSION: 1. Suspected considerable right foraminal impingement at L3-4 likely due to a disc protrusion. Although CT is excellent in characterizing bony impingement in the  spine, MRI is superior to CT in characterizing disc disease. 2. In addition to the suspected right L3-4 impingement, there is moderate impingement at L3-4, L4-5, and L5-S1; and mild impingement at L2-3, as detailed above. 3.  Aortic Atherosclerosis (ICD10-I70.0). Electronically Signed   By: Gaylyn RongWalter  Liebkemann M.D.   On: 04/07/2017 17:01   Mr Lumbar Spine Wo Contrast  Result Date: 04/08/2017 CLINICAL DATA:  Anterior RIGHT leg and RIGHT hip pain beginning yesterday. EXAM: MRI LUMBAR SPINE WITHOUT CONTRAST TECHNIQUE: Multiplanar, multisequence MR imaging of the lumbar spine was performed. No intravenous contrast was administered. COMPARISON:  MRI of the lumbar spine report dated December 20, 2010 though images are not available for direct comparison. FINDINGS: SEGMENTATION: For the purposes of this report, the last well-formed intervertebral disc is reported as L5-S1. ALIGNMENT: Maintained lumbar lordosis. Minimal grade 1 L3-4 anterolisthesis. VERTEBRAE:Vertebral bodies are intact. Moderate to severe L5-S1 disc height loss, minimal L2-3 and L3-4 disc height loss. Disc desiccation L2-3 through L5-S1 with multilevel mild chronic discogenic endplate changes. Bright STIR signal LEFT L3 and L4 pedicles compatible with edema. CONUS MEDULLARIS AND CAUDA EQUINA: Conus medullaris terminates at T12-L1 and demonstrates normal morphology and signal characteristics. Cauda equina is normal. PARASPINAL AND OTHER SOFT TISSUES: Included prevertebral and paraspinal soft tissues are normal. DISC LEVELS: T12-L1 and L1-2: No disc bulge, canal stenosis nor neural foraminal narrowing. L2-3: Small broad-based disc bulge. Mild facet arthropathy and ligamentum flavum redundancy. Mild canal stenosis. No neural foraminal narrowing. L3-4: Anterolisthesis. Moderate broad-based disc bulge and RIGHT extraforaminal moderate disc protrusion versus extrusion displacing the exited RIGHT L3 nerve. Moderate RIGHT greater than LEFT facet arthropathy and  ligamentum flavum redundancy. Mild canal stenosis. Severe RIGHT, moderate LEFT neural foraminal narrowing. L4-5: Small broad-based disc bulge. RIGHT central annular fissure. Mild facet arthropathy and ligamentum flavum  redundancy without canal stenosis. Moderate neural foraminal narrowing. L5-S1: Small RIGHT disc protrusion may affect the traversing RIGHT S1 nerve. Mild facet arthropathy. No canal stenosis. Mild bilateral neural foraminal narrowing. IMPRESSION: 1. RIGHT L3-4 extraforaminal disc protrusion versus extrusion resulting in exited RIGHT L3 nerve impingement. 2. Minimal grade 1 L3-4 anterolisthesis without spondylolysis. Mild LEFT L3 and L4 pedicle bone marrow edema most compatible with stress response. 3. Mild canal stenosis L2-3 and L3-4. Neural foraminal narrowing L3-4 through L5-S1: Severe on the RIGHT at L3-4. Electronically Signed   By: Awilda Metro M.D.   On: 04/08/2017 05:06   Ct Hip Right Wo Contrast  Result Date: 04/07/2017 CLINICAL DATA:  Right anterior leg pain and hip pain. EXAM: CT OF THE RIGHT HIP WITHOUT CONTRAST TECHNIQUE: Multidetector CT imaging of the right hip was performed according to the standard protocol. Multiplanar CT image reconstructions were also generated. COMPARISON:  None. FINDINGS: Bones/Joint/Cartilage Mild spurring of the right acetabulum. No fracture of the right hip or regional pelvis is identified. No hip effusion noted. Ligaments Suboptimally assessed by CT. Muscles and Tendons Unremarkable Soft tissues Mild right common femoral artery atherosclerotic calcification. IMPRESSION: 1. No appreciable fracture or hip joint effusion. 2. Degenerative spurring of the right acetabulum 3. Mild right common femoral artery atherosclerotic calcification. Electronically Signed   By: Gaylyn Rong M.D.   On: 04/07/2017 16:52      Roxy Horseman, PA-C 04/08/17 1610    Geoffery Lyons, MD 04/08/17 0600

## 2017-04-08 NOTE — ED Notes (Signed)
Patient transported to MRI 

## 2017-04-16 DIAGNOSIS — M5126 Other intervertebral disc displacement, lumbar region: Secondary | ICD-10-CM | POA: Diagnosis not present

## 2017-04-16 DIAGNOSIS — M5416 Radiculopathy, lumbar region: Secondary | ICD-10-CM | POA: Diagnosis not present

## 2017-05-02 DIAGNOSIS — M5126 Other intervertebral disc displacement, lumbar region: Secondary | ICD-10-CM | POA: Diagnosis not present

## 2017-05-02 DIAGNOSIS — M5116 Intervertebral disc disorders with radiculopathy, lumbar region: Secondary | ICD-10-CM | POA: Diagnosis not present

## 2017-06-13 DIAGNOSIS — H43813 Vitreous degeneration, bilateral: Secondary | ICD-10-CM | POA: Diagnosis not present

## 2017-06-13 DIAGNOSIS — H04123 Dry eye syndrome of bilateral lacrimal glands: Secondary | ICD-10-CM | POA: Diagnosis not present

## 2017-06-13 DIAGNOSIS — E119 Type 2 diabetes mellitus without complications: Secondary | ICD-10-CM | POA: Diagnosis not present

## 2017-06-17 DIAGNOSIS — F1010 Alcohol abuse, uncomplicated: Secondary | ICD-10-CM | POA: Diagnosis not present

## 2017-06-17 DIAGNOSIS — E783 Hyperchylomicronemia: Secondary | ICD-10-CM | POA: Diagnosis not present

## 2017-06-17 DIAGNOSIS — E119 Type 2 diabetes mellitus without complications: Secondary | ICD-10-CM | POA: Diagnosis not present

## 2017-06-17 DIAGNOSIS — E782 Mixed hyperlipidemia: Secondary | ICD-10-CM | POA: Diagnosis not present

## 2017-06-19 DIAGNOSIS — E119 Type 2 diabetes mellitus without complications: Secondary | ICD-10-CM | POA: Diagnosis not present

## 2017-06-19 DIAGNOSIS — G4709 Other insomnia: Secondary | ICD-10-CM | POA: Diagnosis not present

## 2017-06-19 DIAGNOSIS — M545 Low back pain: Secondary | ICD-10-CM | POA: Diagnosis not present

## 2017-06-19 DIAGNOSIS — E782 Mixed hyperlipidemia: Secondary | ICD-10-CM | POA: Diagnosis not present

## 2017-06-26 DIAGNOSIS — L738 Other specified follicular disorders: Secondary | ICD-10-CM | POA: Diagnosis not present

## 2017-08-01 DIAGNOSIS — Z6823 Body mass index (BMI) 23.0-23.9, adult: Secondary | ICD-10-CM | POA: Diagnosis not present

## 2017-08-01 DIAGNOSIS — Z1231 Encounter for screening mammogram for malignant neoplasm of breast: Secondary | ICD-10-CM | POA: Diagnosis not present

## 2017-08-01 DIAGNOSIS — Z01419 Encounter for gynecological examination (general) (routine) without abnormal findings: Secondary | ICD-10-CM | POA: Diagnosis not present

## 2017-08-13 ENCOUNTER — Other Ambulatory Visit: Payer: Self-pay

## 2017-08-13 ENCOUNTER — Encounter (HOSPITAL_COMMUNITY): Payer: Self-pay | Admitting: *Deleted

## 2017-08-13 ENCOUNTER — Emergency Department (HOSPITAL_COMMUNITY)
Admission: EM | Admit: 2017-08-13 | Discharge: 2017-08-14 | Disposition: A | Discharge status: Other Acute Inpatient Hospital | Payer: BLUE CROSS/BLUE SHIELD | Attending: Emergency Medicine | Admitting: Emergency Medicine

## 2017-08-13 DIAGNOSIS — E119 Type 2 diabetes mellitus without complications: Secondary | ICD-10-CM | POA: Insufficient documentation

## 2017-08-13 DIAGNOSIS — J45909 Unspecified asthma, uncomplicated: Secondary | ICD-10-CM | POA: Insufficient documentation

## 2017-08-13 DIAGNOSIS — F329 Major depressive disorder, single episode, unspecified: Secondary | ICD-10-CM

## 2017-08-13 DIAGNOSIS — Z79899 Other long term (current) drug therapy: Secondary | ICD-10-CM | POA: Diagnosis not present

## 2017-08-13 DIAGNOSIS — Z87891 Personal history of nicotine dependence: Secondary | ICD-10-CM | POA: Diagnosis not present

## 2017-08-13 DIAGNOSIS — F10920 Alcohol use, unspecified with intoxication, uncomplicated: Secondary | ICD-10-CM

## 2017-08-13 DIAGNOSIS — F32A Depression, unspecified: Secondary | ICD-10-CM

## 2017-08-13 DIAGNOSIS — R5381 Other malaise: Secondary | ICD-10-CM | POA: Diagnosis not present

## 2017-08-13 DIAGNOSIS — F10120 Alcohol abuse with intoxication, uncomplicated: Secondary | ICD-10-CM | POA: Diagnosis not present

## 2017-08-13 DIAGNOSIS — F29 Unspecified psychosis not due to a substance or known physiological condition: Secondary | ICD-10-CM | POA: Diagnosis not present

## 2017-08-13 DIAGNOSIS — R45851 Suicidal ideations: Secondary | ICD-10-CM | POA: Insufficient documentation

## 2017-08-13 DIAGNOSIS — F332 Major depressive disorder, recurrent severe without psychotic features: Secondary | ICD-10-CM | POA: Insufficient documentation

## 2017-08-13 DIAGNOSIS — Y905 Blood alcohol level of 100-119 mg/100 ml: Secondary | ICD-10-CM | POA: Diagnosis not present

## 2017-08-13 LAB — CBC WITH DIFFERENTIAL/PLATELET
BASOS ABS: 0 10*3/uL (ref 0.0–0.1)
Basophils Relative: 1 %
Eosinophils Absolute: 0.2 10*3/uL (ref 0.0–0.7)
Eosinophils Relative: 4 %
HEMATOCRIT: 38.6 % (ref 36.0–46.0)
HEMOGLOBIN: 12.6 g/dL (ref 12.0–15.0)
LYMPHS PCT: 35 %
Lymphs Abs: 2 10*3/uL (ref 0.7–4.0)
MCH: 30.2 pg (ref 26.0–34.0)
MCHC: 32.6 g/dL (ref 30.0–36.0)
MCV: 92.6 fL (ref 78.0–100.0)
MONO ABS: 0.5 10*3/uL (ref 0.1–1.0)
Monocytes Relative: 9 %
NEUTROS ABS: 2.9 10*3/uL (ref 1.7–7.7)
NEUTROS PCT: 51 %
Platelets: 201 10*3/uL (ref 150–400)
RBC: 4.17 MIL/uL (ref 3.87–5.11)
RDW: 13.2 % (ref 11.5–15.5)
WBC: 5.7 10*3/uL (ref 4.0–10.5)

## 2017-08-13 LAB — BASIC METABOLIC PANEL
ANION GAP: 10 (ref 5–15)
BUN: 10 mg/dL (ref 8–23)
CHLORIDE: 105 mmol/L (ref 98–111)
CO2: 26 mmol/L (ref 22–32)
Calcium: 9.4 mg/dL (ref 8.9–10.3)
Creatinine, Ser: 0.66 mg/dL (ref 0.44–1.00)
GFR calc Af Amer: >60 mL/min (ref >60)
GFR calc non Af Amer: >60 mL/min (ref >60)
GLUCOSE: 105 mg/dL — AB (ref 70–99)
POTASSIUM: 4.2 mmol/L (ref 3.5–5.1)
Sodium: 141 mmol/L (ref 135–145)

## 2017-08-13 LAB — RAPID URINE DRUG SCREEN, HOSP PERFORMED
Amphetamines: NOT DETECTED
Benzodiazepines: NOT DETECTED
COCAINE: NOT DETECTED
OPIATES: NOT DETECTED
Tetrahydrocannabinol: NOT DETECTED

## 2017-08-13 LAB — ETHANOL: Alcohol, Ethyl (B): 114 mg/dL — ABNORMAL HIGH (ref <10)

## 2017-08-13 LAB — CBG MONITORING, ED: GLUCOSE-CAPILLARY: 82 mg/dL (ref 70–99)

## 2017-08-13 MED ORDER — ZOLPIDEM TARTRATE 5 MG PO TABS
10.0000 mg | ORAL_TABLET | Freq: Every day | ORAL | Status: DC
  Administered 2017-08-13: 10 mg via ORAL

## 2017-08-13 MED ORDER — ROSUVASTATIN CALCIUM 10 MG PO TABS
10.0000 mg | ORAL_TABLET | Freq: Every day | ORAL | Status: DC
  Administered 2017-08-13: 10 mg via ORAL
  Filled 2017-08-13 (×4): qty 1

## 2017-08-13 MED ORDER — LISINOPRIL 5 MG PO TABS
5.0000 mg | ORAL_TABLET | Freq: Every day | ORAL | Status: DC
  Administered 2017-08-13: 5 mg via ORAL
  Filled 2017-08-13: qty 1

## 2017-08-13 MED ORDER — HAIR/SKIN/NAILS/BIOTIN PO TABS: 1.0000 | ORAL_TABLET | Freq: Every day | ORAL | Status: DC

## 2017-08-13 MED ORDER — ZOLPIDEM TARTRATE 5 MG PO TABS
ORAL_TABLET | ORAL | Status: AC
  Filled 2017-08-13: qty 2

## 2017-08-13 MED ORDER — SERTRALINE HCL 50 MG PO TABS
50.0000 mg | ORAL_TABLET | Freq: Every day | ORAL | Status: DC
  Administered 2017-08-13: 50 mg via ORAL
  Filled 2017-08-13: qty 1

## 2017-08-13 MED ORDER — METFORMIN HCL 500 MG PO TABS
1000.0000 mg | ORAL_TABLET | Freq: Two times a day (BID) | ORAL | Status: DC
  Administered 2017-08-14: 1000 mg via ORAL
  Filled 2017-08-13: qty 2

## 2017-08-13 NOTE — ED Notes (Signed)
Almira CoasterGina, [facility] notified of need for 10 mg Crestor

## 2017-08-13 NOTE — ED Triage Notes (Signed)
Pt brought in by [facility] for c/o wanting to "end it all"; pt has had a lot of family members pass away and pt told ems all the people she loves is gone; pt states she has only taken one [place] today but had planned on taking more

## 2017-08-13 NOTE — BH Assessment (Addendum)
Tele Assessment Note   Patient Name: Kendra [name] MRN: [id number] Referring Physician: Clarene [name] Location of Patient: [facility] Location of Provider: Behavioral Health TTS Department  Kendra [name] is an 62 y.o. female who presented by EMS to [facility] with suicidal ideation with plan to overdose on Kendra [name].  Patient states that her brother died in February and she states that she was really close to him.  While he was in palative care, she states that she was supposed to be there with him to help him, but she had to have back surgery because something happened to her back and she could not walk.  She states that he died before she could get there and she has been struggling with this loss.  Patient states that she is also the executor of his estate and she had to arrange for the funeral and could not be there for it and she states that she is having to clean out his house and it saddens her to be going in and out of his home. Patient states that her daughter is out of the country and she states that she has been carting for her dogs and cleaning her house while she is gone, but today her daughter got angry with her for cleaning the house and asked her not to come back to the house that her fiance would take care of things.  Patient states that she was really hurt by this.  She states that things really got to her and she felt overwhelmed.  Patient states that she had taken her neurontin and tramadol earlier today, but when she got home that she drank two glasses of wine and took an Kendra [name].  She states that she went upstairs to bed and she must have been out of it because her husband got upset and called EMS.  Patient states that she is really depressed and states that she has been having suicidal thoughts, but has never acted on them until today.  Patient states that she told EMS that she planned to take more pills.  Patient states that she feels like her family would be better off without her.  Patient states  that she has been married for 30+ years and her husband is supportive.  She has one daughter age 131.  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  Patient states that she is financially stable and is able to take care of her husband.  Patient states that she has a dog that she loves and has reasons to be happy, but states that she is very depressed.  She states that she has always struggled with grief.  In 2000 when her mother died she states that she sought counseling through her EAP and states that she was prescribed sertraline and has been on it ever since.  Patient states that she feels like the medication helps her.  Patient denies HI/Psychosis.  Patient states that she drinks a bottle of wine weekly, but states that she does not feel like she has a problem with it.  Patient states that her appetite is good, but states that she cannot sleep at all without taking Kendra [name].  Patient denies any history of abuse or self-mutilation.  Patient presents as alert and oriented and looks younger than her stated age.  Patient was cooperative and pleasant, but very tearful during her assessment and indicated that she feels hopeless at times.  Patient maintains good eye  contact, her speech is clear and coherent and her thoughts are organized.  Her memory appears to be intact.  She is moderately anxious and a little restless.  She does not appear to be responding to any internal or external stimuli.   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 Anxiety   . Arthritis   . Asthma   . Cataracts, bilateral   . Depression   . Diabetes type 2, controlled (HCC)   . Hyperlipidemia   . Seasonal allergies     Past Surgical History:  Procedure Laterality Date  . BLEPHAROPLASTY  2009  . CATARACT EXTRACTION, BILATERAL  2011, 2013  . lumbar disc removal   1991    Family History:  Family History  Problem Relation Age of Onset  . Breast cancer Paternal Grandmother   . Colon cancer Neg Hx   . Esophageal cancer Neg Hx   . Rectal cancer Neg Hx   . Prostate cancer Neg Hx     Social History:  reports that she quit smoking about 36 years ago. She has never used smokeless tobacco. She reports that she drinks alcohol. She reports that she does not use drugs.  Additional Social History:  Alcohol / Drug Use Pain Medications: denies Prescriptions: denies abuse of her prescription medications Over the Counter: denies History of alcohol / drug use?: Yes Longest period of sobriety (when/how long): none reported Negative Consequences of Use: Personal relationships(because of family history of addiction, husband is concerned that she drinks) Substance #1 Name of Substance 1: alcohol 1 - Age of First Use: 14 1 - Amount (size/oz): bottle of wine 1 - Frequency: weekly 1 - Duration: since onset 1 - Last Use / Amount: 2 glasses of wine today  CIWA: CIWA-Ar BP: 120/85 Pulse Rate: 94 COWS:    Allergies: No Known Allergies  Home Medications:  (Not in a hospital admission)  OB/GYN Status:  No LMP recorded. Patient is postmenopausal.  General Assessment Data Location of Assessment: [facility] TTS Assessment: In system Is this a Tele or Face-to-Face Assessment?: Tele Assessment Is this an Initial Assessment or a Re-assessment for this encounter?: Initial Assessment Marital status: Married [place] name: Kendra [name] Is patient pregnant?: No Pregnancy Status: No Living Arrangements: Spouse/significant other Can pt return to current living arrangement?: Yes Admission Status: Voluntary Is patient capable of signing voluntary admission?: Yes Referral Source: Self/Family/Friend Insurance type: Biomedical scientist)     Crisis Care Plan Living Arrangements: Spouse/significant other Legal Guardian: Other:(self) Name of Psychiatrist: (none) Name of  Therapist: (none)  Education Status Is patient currently in school?: No Is the patient employed, unemployed or receiving disability?: (retired)  Risk to self with the past 6 months Suicidal Ideation: Yes-Currently Present Has patient been a risk to self within the past 6 months prior to admission? : No Suicidal Intent: Yes-Currently Present Has patient had any suicidal intent within the past 6 months prior to admission? : No Is patient at risk for suicide?: Yes(recent death of brother she was close to) Suicidal Plan?: Yes-Currently Present Has patient had any suicidal plan within the past 6 months prior to admission? : No Specify Current Suicidal Plan: (overdose on [place]) Access to Means: Yes Specify Access to Suicidal Means: Remus Loffler) What has been your use of drugs/alcohol within the last 12 months?: (drinks bottle of wine weekly, 2 glasses today) Previous Attempts/Gestures: No How many times?: (0) Other Self Harm  Risks: (grief) Triggers for Past Attempts: None known Intentional Self Injurious Behavior: None Family Suicide History: No Recent stressful life event(s): Loss (Comment), Other (Comment) Persecutory voices/beliefs?: (death of brother and daughter is out of country) Depression: Yes Depression Symptoms: Despondent, Tearfulness, Isolating, Loss of interest in usual pleasures, Feeling worthless/self pity Substance abuse history and/or treatment for substance abuse?: Yes(to much "wine" to fast per spouse report.) Suicide prevention information given to non-admitted patients: Not applicable  Risk to Others within the past 6 months Homicidal Ideation: No Does patient have any lifetime risk of violence toward others beyond the six months prior to admission? : No Thoughts of Harm to Others: No Current Homicidal Intent: No Current Homicidal Plan: No Access to Homicidal Means: No Identified Victim: none History of harm to others?: No Assessment of Violence: None  Noted Violent Behavior Description: none Does patient have access to weapons?: No Criminal Charges Pending?: No Does patient have a court date: No Is patient on probation?: No  Psychosis Hallucinations: None noted Delusions: None noted  Mental Status Report Appearance/Hygiene: Unremarkable Eye Contact: Good Motor Activity: Freedom of movement Speech: Logical/coherent Level of Consciousness: Alert Mood: Depressed, Anxious, Apathetic Affect: Depressed, Sad Anxiety Level: Moderate Thought Processes: Coherent, Relevant Judgement: Impaired Orientation: Person, Place, Time, Situation Obsessive Compulsive Thoughts/Behaviors: None  Cognitive Functioning Concentration: Normal Memory: Recent Intact, Remote Intact Is patient IDD: No Is patient DD?: No Insight: Fair Impulse Control: Fair Appetite: Good Have you had any weight changes? : No Change Sleep: Decreased Total Hours of Sleep: 0(can sleep 8 hours with Kendra [name]) Vegetative Symptoms: None  ADLScreening [facility] Assessment Services) Patient's cognitive ability adequate to safely complete daily activities?: Yes Patient able to express need for assistance with ADLs?: Yes Independently performs ADLs?: Yes (appropriate for developmental age)  Prior Inpatient Therapy Prior Inpatient Therapy: No  Prior Outpatient Therapy Prior Outpatient Therapy: Yes Prior Therapy Dates: 2000 Prior Therapy Facilty/Provider(s): [facility] Reason for Treatment: depression Does patient have an ACCT team?: No Does patient have Intensive In-House Services?  : No Does patient have [facility] services? : No Does patient have P4CC services?: No  ADL Screening (condition at time of admission) Patient's cognitive ability adequate to safely complete daily activities?: Yes Is the patient deaf or have difficulty hearing?: No Does the patient have difficulty seeing, even when wearing glasses/contacts?: No Does the patient have difficulty concentrating,  remembering, or making decisions?: No Patient able to express need for assistance with ADLs?: Yes Does the patient have difficulty dressing or bathing?: No Independently performs ADLs?: Yes (appropriate for developmental age) Does the patient have difficulty walking or climbing stairs?: No Weakness of Legs: None Weakness of Arms/Hands: None     Therapy Consults (therapy consults require a physician order) PT Evaluation Needed: No OT Evalulation Needed: No SLP Evaluation Needed: No Abuse/Neglect Assessment (Assessment to be complete while patient is alone) Abuse/Neglect Assessment Can Be Completed: Yes Physical Abuse: Denies Verbal Abuse: Denies Sexual Abuse: Denies Exploitation of patient/patient's resources: Denies Self-Neglect: Denies Values / Beliefs Cultural Requests During Hospitalization: None Spiritual Requests During Hospitalization: None Consults Spiritual Care Consult Needed: No Social Work Consult Needed: No Merchant navy officer (For Healthcare) Does Patient Have a Medical Advance Directive?: No Nutrition Screen- [facility] Adult/[facility] Has the patient recently lost weight without trying?: No Has the patient been eating poorly because of a decreased appetite?: No Malnutrition Screening Tool Score: 0  Additional Information 1:1 In Past 12 Months?: No CIRT Risk: No Elopement Risk: No Does patient have medical clearance?: No  Disposition: Per Nira Conn, NP,  Inpatient Treatment is recommended Disposition Initial Assessment Completed for this Encounter: Yes Disposition of Patient: Admit Type of inpatient treatment program: Adult  This service was provided via telemedicine using a 2-way, interactive audio and video technology.  Names of all persons participating in this telemedicine service and their role in this encounter. Name: Kaveri Perras Role: patient  Name: Dannielle Huh Shyane Fossum Role: TTS  Name:  Role:   Name:  Role:     Daphene Calamity 08/13/2017 10:17 PM

## 2017-08-13 NOTE — ED Notes (Signed)
Pt walked to family room with Pattricia BossAnnie, sitter for TTS consult.

## 2017-08-13 NOTE — ED Notes (Signed)
Pt awake at this time. Pt has ate dinner tray (100%). INformed pt was waiting for medication to come from pharmacy and would give all three at same time.

## 2017-08-13 NOTE — BH Assessment (Signed)
[facility] Assessment Progress Note    [facility].

## 2017-08-13 NOTE — ED Provider Notes (Signed)
[facility] EMERGENCY DEPARTMENT Provider Note   CSN: [id number] Arrival date & time: 08/13/17  2014     History   Chief Complaint Chief Complaint  Patient presents with  . V70.1    HPI Kendra [name] is a 62 y.o. female.  HPI  Pt was seen at 2040. Per pt and her family, c/o gradual onset and worsening of persistent depression and SI for the past several days. Pt told EMS that she wanted to "end it all." Pt's family states she told them she "wanted to join her brother" who recently died. Pt endorses taking one [place] today but had planned on taking more. States to me she "didn't care" what it would do to her. Pt also endorses etoh use this evening. Denies SA, no HI, no hallucinations.    Past Medical History:  Diagnosis Date  . Anxiety   . Arthritis   . Asthma   . Cataracts, bilateral   . Depression   . Diabetes type 2, controlled (HCC)   . Hyperlipidemia   . Seasonal allergies     Patient Active Problem List   Diagnosis Date Noted  . Type II or unspecified type diabetes mellitus without mention of complication, not stated as uncontrolled 03/03/2010  . HYPERCHOLESTEROLEMIA 03/03/2010  . HYPERTENSION 03/03/2010  . MENOPAUSE-RELATED VASOMOTOR SYMPTOMS, HOT FLASHES 03/03/2010  . OSTEOPENIA 03/03/2010  . FATIGUE 03/03/2010  . PAP SMEAR, ABNORMAL 03/03/2010    Past Surgical History:  Procedure Laterality Date  . BLEPHAROPLASTY  2009  . CATARACT EXTRACTION, BILATERAL  2011, 2013  . lumbar disc removal  1991     OB History   None      Home Medications    Prior to Admission medications   Medication Sig Start Date End Date Taking? Authorizing Provider  albuterol (PROAIR HFA) 108 (90 Base) MCG/ACT inhaler Inhale 1-2 puffs into the lungs every 4 (four) hours as needed for wheezing or shortness of breath.    [provider]  Cholecalciferol (VITAMIN D3 PO) Take 1 tablet by mouth at bedtime.    [provider]  clotrimazole-betamethasone  (LOTRISONE) cream Apply 1 application topically daily as needed (psoriasis/eczema).    [provider]  cyclobenzaprine (FLEXERIL) 10 MG tablet Take 1 tablet (10 mg total) by mouth 2 (two) times daily as needed for muscle spasms. 04/08/17   Roxy Horseman, PA-C  Fexofenadine HCl (ALLEGRA PO) Take 1 tablet by mouth daily.    [provider]  lisinopril (PRINIVIL,ZESTRIL) 5 MG tablet Take 5 mg by mouth at bedtime.     [provider]  metFORMIN (GLUCOPHAGE) 1000 MG tablet Take 1,000 mg by mouth 2 (two) times daily. 01/09/17   [provider]  Multiple Vitamins-Minerals (HAIR/SKIN/NAILS/BIOTIN) TABS Take 1 tablet by mouth at bedtime.    [provider]  naproxen (NAPROSYN) 500 MG tablet Take 500 mg by mouth 4 (four) times daily as needed (pain).     [provider]  omega-3 acid ethyl esters (LOVAZA) 1 G capsule Take 4 g by mouth at bedtime.     [provider]  Polyethyl Glycol-Propyl Glycol (SYSTANE OP) Place 1 drop into both eyes daily as needed (dry eyes).    [provider]  rosuvastatin (CRESTOR) 10 MG tablet Take 10 mg by mouth at bedtime.     [provider]  sertraline (ZOLOFT) 100 MG tablet Take 50 mg by mouth at bedtime.     [provider]  traMADol (ULTRAM) 50 MG tablet  Take 50 mg by mouth every 6 (six) hours as needed (pain).    [provider]  zolpidem (AMBIEN) 10 MG tablet Take 10 mg by mouth at bedtime.     [provider]    Family History Family History  Problem Relation Age of Onset  . Breast cancer Paternal Grandmother   . Colon cancer Neg Hx   . Esophageal cancer Neg Hx   . Rectal cancer Neg Hx   . Prostate cancer Neg Hx     Social History Social History   Tobacco Use  . Smoking status: Former Smoker    Last attempt to quit: 02/12/1981    Years since quitting: 36.5  . Smokeless tobacco: Never Used  Substance Use Topics  . Alcohol use: Yes    Alcohol/week:  0.0 oz    Comment: rare  . Drug use: No     Allergies   Patient has no known allergies.   Review of Systems Review of Systems ROS: Statement: All systems negative except as marked or noted in the HPI; Constitutional: Negative for fever and chills. ; ; Eyes: Negative for eye pain, redness and discharge. ; ; ENMT: Negative for ear pain, hoarseness, nasal congestion, sinus pressure and sore throat. ; ; Cardiovascular: Negative for chest pain, palpitations, diaphoresis, dyspnea and peripheral edema. ; ; Respiratory: Negative for cough, wheezing and stridor. ; ; Gastrointestinal: Negative for nausea, vomiting, diarrhea, abdominal pain, blood in stool, hematemesis, jaundice and rectal bleeding. . ; ; Genitourinary: Negative for dysuria, flank pain and hematuria. ; ; Musculoskeletal: Negative for back pain and neck pain. Negative for swelling and trauma.; ; Skin: Negative for pruritus, rash, abrasions, blisters, bruising and skin lesion.; ; Neuro: Negative for headache, lightheadedness and neck stiffness. Negative for weakness, altered level of consciousness, altered mental status, extremity weakness, paresthesias, involuntary movement, seizure and syncope.; Psych:  +depresion, +SI. No SA, no HI, no hallucinations.        Physical Exam Updated Vital Signs BP 120/85   Pulse 94   Temp 98.9 F (37.2 C) (Oral)   Resp 20   Ht 5' 5.5" (1.664 m)   Wt 64.4 kg (142 lb)   SpO2 96%   BMI 23.27 kg/m   Physical Exam 2045: Physical examination:  Nursing notes reviewed; Vital signs and O2 SAT reviewed;  Constitutional: Well developed, Well nourished, Well hydrated, In no acute distress; Head:  Normocephalic, atraumatic; Eyes: EOMI, PERRL, No scleral icterus; ENMT: Mouth and pharynx normal, Mucous membranes moist; Neck: Supple, Full range of motion; Cardiovascular: Regular rate and rhythm; Respiratory: Breath sounds clear, No wheezes.  Speaking full sentences with ease, Normal respiratory  effort/excursion; Chest: No deformity, Movement normal; Abdomen: Nondistended; Extremities: No deformity.; Neuro: AA&Ox3, Major CN grossly intact.  Speech clear. No gross focal motor deficits in extremities. Climbs on and off stretcher easily by herself. Gait steady.; Skin: Color normal, Warm, Dry.; Psych:  Affect flat.    ED Treatments / Results  Labs (all labs ordered are listed, but only abnormal results are displayed)   EKG None  Radiology   Procedures Procedures (including critical care time)  Medications Ordered in ED Medications - No data to display   Initial Impression / Assessment and Plan / ED Course  I have reviewed the triage vital signs and the nursing notes.  Pertinent labs & imaging results that were available during my care of the patient were reviewed by me and considered in my medical decision making (see chart for details).  MDM Reviewed: previous chart, nursing note and vitals Reviewed previous: labs Interpretation: labs   Results for orders placed or performed during the hospital encounter of 08/13/17  Urine rapid drug screen (hosp performed)  Result Value Ref Range   Opiates NONE DETECTED NONE DETECTED   Cocaine NONE DETECTED NONE DETECTED   Benzodiazepines NONE DETECTED NONE DETECTED   Amphetamines NONE DETECTED NONE DETECTED   Tetrahydrocannabinol NONE DETECTED NONE DETECTED   Barbiturates (A) NONE DETECTED    Result not available. Reagent lot number recalled by manufacturer.  Basic metabolic panel  Result Value Ref Range   Sodium 141 135 - 145 mmol/L   Potassium 4.2 3.5 - 5.1 mmol/L   Chloride 105 98 - 111 mmol/L   CO2 26 22 - 32 mmol/L   Glucose, Bld 105 (H) 70 - 99 mg/dL   BUN 10 8 - 23 mg/dL   Creatinine, Ser 1.61 0.44 - 1.00 mg/dL   Calcium 9.4 8.9 - 09.6 mg/dL   GFR calc non Af Amer >60 >60 mL/min   GFR calc Af Amer >60 >60 mL/min   Anion gap 10 5 - 15  Ethanol  Result Value Ref Range   Alcohol, Ethyl (B) 114 (H) <10 mg/dL  CBC  with Differential  Result Value Ref Range   WBC 5.7 4.0 - 10.5 K/uL   RBC 4.17 3.87 - 5.11 MIL/uL   Hemoglobin 12.6 12.0 - 15.0 g/dL   HCT 04.5 40.9 - 81.1 %   MCV 92.6 78.0 - 100.0 fL   MCH 30.2 26.0 - 34.0 pg   MCHC 32.6 30.0 - 36.0 g/dL   RDW 91.4 78.2 - 95.6 %   Platelets 201 150 - 400 K/uL   Neutrophils Relative % 51 %   Neutro Abs 2.9 1.7 - 7.7 K/uL   Lymphocytes Relative 35 %   Lymphs Abs 2.0 0.7 - 4.0 K/uL   Monocytes Relative 9 %   Monocytes Absolute 0.5 0.1 - 1.0 K/uL   Eosinophils Relative 4 %   Eosinophils Absolute 0.2 0.0 - 0.7 K/uL   Basophils Relative 1 %   Basophils Absolute 0.0 0.0 - 0.1 K/uL     2140:  Will have TTS consult. Holding orders written.     Final Clinical Impressions(s) / ED Diagnoses   Final diagnoses:  None    ED Discharge Orders    None       Samuel Jester, DO 08/13/17 2213

## 2017-08-13 NOTE — ED Notes (Signed)
Pt has been wanded by security- pt in paper scrubs at this time.

## 2017-08-14 ENCOUNTER — Inpatient Hospital Stay (HOSPITAL_COMMUNITY)
Admission: AD | Admit: 2017-08-14 | Discharge: 2017-08-16 | DRG: 885 | Disposition: A | Discharge status: Home / Self Care | Payer: BLUE CROSS/BLUE SHIELD | Source: Intra-hospital | Attending: Psychiatry | Admitting: Psychiatry

## 2017-08-14 ENCOUNTER — Encounter (HOSPITAL_COMMUNITY): Payer: Self-pay

## 2017-08-14 ENCOUNTER — Other Ambulatory Visit: Payer: Self-pay

## 2017-08-14 DIAGNOSIS — Z9842 Cataract extraction status, left eye: Secondary | ICD-10-CM

## 2017-08-14 DIAGNOSIS — Z961 Presence of intraocular lens: Secondary | ICD-10-CM | POA: Diagnosis present

## 2017-08-14 DIAGNOSIS — E785 Hyperlipidemia, unspecified: Secondary | ICD-10-CM | POA: Diagnosis present

## 2017-08-14 DIAGNOSIS — E7800 Pure hypercholesterolemia, unspecified: Secondary | ICD-10-CM | POA: Diagnosis present

## 2017-08-14 DIAGNOSIS — G4700 Insomnia, unspecified: Secondary | ICD-10-CM | POA: Diagnosis present

## 2017-08-14 DIAGNOSIS — R45851 Suicidal ideations: Secondary | ICD-10-CM | POA: Diagnosis not present

## 2017-08-14 DIAGNOSIS — M1990 Unspecified osteoarthritis, unspecified site: Secondary | ICD-10-CM | POA: Diagnosis present

## 2017-08-14 DIAGNOSIS — F411 Generalized anxiety disorder: Secondary | ICD-10-CM | POA: Diagnosis present

## 2017-08-14 DIAGNOSIS — F329 Major depressive disorder, single episode, unspecified: Secondary | ICD-10-CM | POA: Diagnosis not present

## 2017-08-14 DIAGNOSIS — Z630 Problems in relationship with spouse or partner: Secondary | ICD-10-CM | POA: Diagnosis not present

## 2017-08-14 DIAGNOSIS — M8580 Other specified disorders of bone density and structure, unspecified site: Secondary | ICD-10-CM | POA: Diagnosis present

## 2017-08-14 DIAGNOSIS — I10 Essential (primary) hypertension: Secondary | ICD-10-CM | POA: Diagnosis present

## 2017-08-14 DIAGNOSIS — Z87891 Personal history of nicotine dependence: Secondary | ICD-10-CM | POA: Diagnosis not present

## 2017-08-14 DIAGNOSIS — E119 Type 2 diabetes mellitus without complications: Secondary | ICD-10-CM | POA: Diagnosis not present

## 2017-08-14 DIAGNOSIS — F339 Major depressive disorder, recurrent, unspecified: Principal | ICD-10-CM | POA: Diagnosis present

## 2017-08-14 DIAGNOSIS — Z803 Family history of malignant neoplasm of breast: Secondary | ICD-10-CM | POA: Diagnosis not present

## 2017-08-14 DIAGNOSIS — Z9841 Cataract extraction status, right eye: Secondary | ICD-10-CM

## 2017-08-14 DIAGNOSIS — Z79899 Other long term (current) drug therapy: Secondary | ICD-10-CM | POA: Diagnosis not present

## 2017-08-14 DIAGNOSIS — F332 Major depressive disorder, recurrent severe without psychotic features: Secondary | ICD-10-CM | POA: Diagnosis not present

## 2017-08-14 LAB — GLUCOSE, CAPILLARY: Glucose-Capillary: 99 mg/dL (ref 70–99)

## 2017-08-14 LAB — CBG MONITORING, ED: GLUCOSE-CAPILLARY: 159 mg/dL — AB (ref 70–99)

## 2017-08-14 MED ORDER — ZOLPIDEM TARTRATE 5 MG PO TABS
5.0000 mg | ORAL_TABLET | Freq: Every day | ORAL | Status: DC
  Administered 2017-08-14: 5 mg via ORAL
  Filled 2017-08-14: qty 1

## 2017-08-14 MED ORDER — OMEGA-3-ACID ETHYL ESTERS 1 G PO CAPS
1.0000 g | ORAL_CAPSULE | Freq: Four times a day (QID) | ORAL | Status: DC
  Administered 2017-08-14 – 2017-08-16 (×8): 1 g via ORAL
  Filled 2017-08-14 (×17): qty 1

## 2017-08-14 MED ORDER — ALUM & MAG HYDROXIDE-SIMETH 200-200-20 MG/5ML PO SUSP: 30.0000 mL | ORAL | Status: DC | PRN

## 2017-08-14 MED ORDER — LORAZEPAM 0.5 MG PO TABS: 0.5000 mg | ORAL_TABLET | Freq: Four times a day (QID) | ORAL | Status: DC | PRN

## 2017-08-14 MED ORDER — FOLIC ACID 1 MG PO TABS
1.0000 mg | ORAL_TABLET | Freq: Every day | ORAL | Status: DC
  Administered 2017-08-14 – 2017-08-16 (×3): 1 mg via ORAL
  Filled 2017-08-14 (×5): qty 1

## 2017-08-14 MED ORDER — LISINOPRIL 5 MG PO TABS
5.0000 mg | ORAL_TABLET | Freq: Every day | ORAL | Status: DC
  Administered 2017-08-14 – 2017-08-16 (×3): 5 mg via ORAL
  Filled 2017-08-14 (×5): qty 1

## 2017-08-14 MED ORDER — HYDROXYZINE HCL 25 MG PO TABS
25.0000 mg | ORAL_TABLET | Freq: Four times a day (QID) | ORAL | Status: DC | PRN
  Administered 2017-08-15: 25 mg via ORAL
  Filled 2017-08-14: qty 1

## 2017-08-14 MED ORDER — ACETAMINOPHEN 325 MG PO TABS: 650.0000 mg | ORAL_TABLET | Freq: Four times a day (QID) | ORAL | Status: DC | PRN

## 2017-08-14 MED ORDER — METFORMIN HCL 500 MG PO TABS
1000.0000 mg | ORAL_TABLET | Freq: Two times a day (BID) | ORAL | Status: DC
  Administered 2017-08-14 – 2017-08-16 (×4): 1000 mg via ORAL
  Filled 2017-08-14 (×8): qty 2

## 2017-08-14 MED ORDER — ROSUVASTATIN CALCIUM 10 MG PO TABS
10.0000 mg | ORAL_TABLET | Freq: Every day | ORAL | Status: DC
  Administered 2017-08-14 – 2017-08-15 (×2): 10 mg via ORAL
  Filled 2017-08-14 (×4): qty 1

## 2017-08-14 MED ORDER — NAPROXEN 500 MG PO TABS: 500.0000 mg | ORAL_TABLET | Freq: Two times a day (BID) | ORAL | Status: DC | PRN

## 2017-08-14 MED ORDER — MAGNESIUM HYDROXIDE 400 MG/5ML PO SUSP: 30.0000 mL | Freq: Every day | ORAL | Status: DC | PRN

## 2017-08-14 MED ORDER — CYCLOBENZAPRINE HCL 10 MG PO TABS: 5.0000 mg | ORAL_TABLET | Freq: Three times a day (TID) | ORAL | Status: DC | PRN

## 2017-08-14 MED ORDER — TRAMADOL HCL 50 MG PO TABS: 50.0000 mg | ORAL_TABLET | Freq: Four times a day (QID) | ORAL | Status: DC | PRN

## 2017-08-14 MED ORDER — TRAZODONE HCL 50 MG PO TABS: 50.0000 mg | ORAL_TABLET | Freq: Every evening | ORAL | Status: DC | PRN

## 2017-08-14 MED ORDER — VITAMIN B-1 100 MG PO TABS
100.0000 mg | ORAL_TABLET | Freq: Every day | ORAL | Status: DC
Start: 2017-08-14 — End: 2017-08-16
  Administered 2017-08-14 – 2017-08-16 (×3): 100 mg via ORAL
  Filled 2017-08-14 (×5): qty 1

## 2017-08-14 MED ORDER — SERTRALINE HCL 50 MG PO TABS
150.0000 mg | ORAL_TABLET | Freq: Every day | ORAL | Status: DC
  Administered 2017-08-14 – 2017-08-16 (×3): 150 mg via ORAL
  Filled 2017-08-14 (×5): qty 3

## 2017-08-14 NOTE — Tx Team (Signed)
Initial Treatment Plan 08/14/2017 4:21 PM Mellody LifeJennifer S Eisenhour YNW:[id number]    PATIENT STRESSORS: Health problems   PATIENT STRENGTHS: Ability for insight Average or above average intelligence Communication skills Supportive family/friends   PATIENT IDENTIFIED PROBLEMS: "coping skills"  "dealing with loss"  Suicidal thoughts  Depression  Anxiety             DISCHARGE CRITERIA:  Ability to meet basic life and health needs Motivation to continue treatment in a less acute level of care  PRELIMINARY DISCHARGE PLAN: Attend aftercare/continuing care group Attend PHP/IOP Outpatient therapy Return to previous living arrangement  PATIENT/FAMILY INVOLVEMENT: This treatment plan has been presented to and reviewed with the patient, Mellody LifeJennifer S Kemp, and/or family member.  The patient and family have been given the opportunity to ask questions and make suggestions.  Clarene CritchleyElizabeth O Miles Borkowski, RN 08/14/2017, 4:21 PM

## 2017-08-14 NOTE — Therapy (Signed)
Occupational Therapy Group Treatment Note  Date:  08/14/2017 Time:  2:39 PM  Group Topic/Focus:  Stress Management  Participation Level:  Active  Participation Quality:  Appropriate  Affect:  Flat  Cognitive:  Appropriate  Insight: Improving  Engagement in Group:  Engaged  Modes of Intervention:  Activity, Discussion, Education and Socialization  Additional Comments:    S: "I like to garden to relieve stress"  O: Stress management group completed to use as productive coping strategy, to help mitigate maladaptive coping to integrate in functional BADL/IADL.  Stress management tool worksheet discussed to educate on unhealthy vs healthy coping skills to manage stress to improve community integration. Coping strategies taught include: relaxation based- deep breathing, counting to 10, taking a 1 minute vacation, acceptance, stress balls, relaxation audio/video, visual/mental imagery. Positive mental attitude- gratitude, acceptance, cognitive reframing, positive self talk, anger management. Coping skills bingo played with education given on variety of coping skills between bingo calls. Pts encouraged to share experience with various coping skills and share what has worked for them with others. Coloring and relaxation guide handouts given at the end of the session.   A: Pt presents to group with flat/depressed affect, but engaged throughout entirety of group, pt is guarded. Stress management tools worksheet completed, pt stating she often suppresses emotions or is passive. She would like to try some relaxation techniques this date (breathing, PMR, counting). Pt engaged in coping skills bingo, stating she likes to garden when stressed when challenged to think of coping skill not yet listed in activities. Pt acquired handouts at end of session.   P: Pt provided with education on stress management activities to implement into daily routine. Handouts given to facilitate carryover when reintegrating  into community     [facility] Surgery CenterKaylee Carthel [name], [place] YorkMSOT, OTR/L  AvnetKaylee Joanna [name] 08/14/2017, 2:39 PM

## 2017-08-14 NOTE — Progress Notes (Signed)
Admission Note: Patient is an 62 year old female admitted to the unit for symptoms of depression and suicidal ideation.  Patient plan to overdose on her prescribed medications.  Reports stressors as loss of her brother and an uncle with terminal cancer.  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  Patient contracts for safety while in the hospital.  Patient appears sad and tearful upon approach.  States she is here to get help and to interact.  Admission plan of care reviewed and consent signed.  Skin assessment completed.  Skin is dry and intact.  Patient oriented to the unit, staff and room.  Support and encouragement offered as needed.  Verbalizes understanding of unit rules and protocols.  Patient is safe on the unit.

## 2017-08-14 NOTE — [facility] Suicide Risk Assessment (Signed)
[facility]   Nursing information obtained from:    Demographic factors:    Current Mental Status:    Loss Factors:    Historical Factors:    Risk Reduction Factors:     Total Time spent with patient: 45 minutes Principal Problem: <principal problem not specified> Diagnosis:   Patient Active Problem List   Diagnosis Date Noted  . Major depression, recurrent, chronic (HCC) [F33.9] 08/14/2017  . Type II or unspecified type diabetes mellitus without mention of complication, not stated as uncontrolled [E11.9] 03/03/2010  . HYPERCHOLESTEROLEMIA [E78.00] 03/03/2010  . HYPERTENSION [I10] 03/03/2010  . MENOPAUSE-RELATED VASOMOTOR SYMPTOMS, HOT FLASHES [N95.1] 03/03/2010  . OSTEOPENIA [M89.9, M94.9] 03/03/2010  . FATIGUE [R53.81, R53.83] 03/03/2010  . PAP SMEAR, ABNORMAL [R87.619] 03/03/2010   Subjective Data: Patient is seen and examined.  Patient is a 62 year old female with a past psychiatric history significant for major depression who presented to [facility] department yesterday with suicidal ideation.  Patient stated that her brother had been ill chronically, and died in February of this year.  She had gone and visited him on regular occasions prior to his death.  The weekend that he died she had hurt her back and was unable to go visit him.  The fact that he died that we can is caused her to have great guilt.  She also stated that she had had an argument with her daughter yesterday, and that just made things worse.  She also has executor of the brothers will, and and had to arrange all the funeral issues, and was going to have to clean out his house.  She felt this overwhelming  She has been treated for several years with sertraline between 50 and 100 mg.  She stated that prior to his death in February it had been working well.  She started this after a similar episode with the illness or death in 1 of her other family members.  She admitted to crying  spells, helplessness, hopelessness and worthlessness.  She was going to overdose on Ambien.  She also had significant alcohol on the date of admission.  Her blood alcohol in the emergency room was greater than 100.  She was admitted to the hospital for evaluation and stabilization.  Continued Clinical Symptoms:    The "Alcohol Use Disorders Identification Test", Guidelines for Use in Primary Care, Second Edition.  World Science writerHealth Organization [facility](WHO). Score between 0-7:  no or low risk or alcohol related problems. Score between 8-15:  moderate risk of alcohol related problems. Score between 16-19:  high risk of alcohol related problems. Score 20 or above:  warrants further diagnostic evaluation for alcohol dependence and treatment.   CLINICAL FACTORS:   Depression:   Anhedonia Hopelessness Impulsivity Insomnia   Musculoskeletal: Strength & Muscle Tone: within normal limits Gait & Station: normal Patient leans: N/A  Psychiatric Specialty Exam: Physical Exam  Nursing note and vitals reviewed. Constitutional: She is oriented to person, place, and time. She appears well-developed and well-nourished.  HENT:  Head: Normocephalic and atraumatic.  Respiratory: Effort normal.  Neurological: She is alert and oriented to person, place, and time.    ROS  There were no vitals taken for this visit.There is no height or weight on file to calculate BMI.  General Appearance: Disheveled  Eye Contact:  Fair  Speech:  Normal Rate  Volume:  Normal  Mood:  Anxious and Depressed  Affect:  Congruent  Thought Process:  Coherent  Orientation:  Full (Time, Place, and Person)  Thought Content:  Logical  Suicidal Thoughts:  No  Homicidal Thoughts:  No  Memory:  Immediate;   Fair Recent;   Fair Remote;   Fair  Judgement:  Intact  Insight:  Fair  Psychomotor Activity:  Increased  Concentration:  Concentration: Fair and Attention Span: Fair  Recall:  [employer] of Knowledge:  Fair  Language:  Good   Akathisia:  Negative  Handed:  Right  AIMS (if indicated):     Assets:  Communication Skills Desire for Improvement Financial Resources/Insurance Housing Resilience Social Support  ADL's:  Intact  Cognition:  WNL  Sleep:         COGNITIVE FEATURES THAT CONTRIBUTE TO RISK:  None    SUICIDE RISK:   Mild:  Suicidal ideation of limited frequency, intensity, duration, and specificity.  There are no identifiable plans, no associated intent, mild dysphoria and related symptoms, good self-control (both objective and subjective assessment), few other risk factors, and identifiable protective factors, including available and accessible social support.  PLAN OF CARE: Patient is seen and examined.  Patient is a 62 year old female with the above-stated past psychiatric history seen on admission.  We discussed options for treatment.  We will increase her sertraline 150 mg p.o. daily.  Her primary care doctor is been trying to get her to cut back or move away from Ambien, and she is been on for many years.  I am going to decrease her dosage to 5 mg p.o. nightly, and we will attempt to wean her off of this during the course of the hospitalization.  Trazodone 50 mg p.o. nightly will be available.  We will continue her regular medications for her diabetes.  She also has significant hyperlipidemia and her lavage as well as Crestor will be continued.  I am concerned about her alcohol intake.  Liver function enzymes were not obtained in the emergency room, and I am going to order those today.  She also has significant hyperlipidemia and we will obtain a lipid panel, and as a diabetic we will check her hemoglobin A1c.  She will be admitted to the unit.  She will be integrated into the milieu.  She will be seen by social work both individually and in groups.  She will be encouraged to attend groups.  Hopefully this will benefit her during the course the hospitalization.  I certify that inpatient services  furnished can reasonably be expected to improve the patient's condition.   Antonieta Pert, MD 08/14/2017, 1:22 PM

## 2017-08-14 NOTE — Progress Notes (Addendum)
[facility]  Nira ConnJason Berry, NP is the accepting provider.  Dr. Landry MellowGreg Clary is the attending provider.  Call report to [phone]  [email or web address]  [email or web address] notified.   Pt is Voluntary.  Pt may be transported by [facility]  [facility] can be [facility].  Timmothy EulerJean T. Kaylyn LimSutter, MSW, LCSWA Disposition Clinical Social Work [phone] (cell) [phone] (office)

## 2017-08-15 DIAGNOSIS — Z630 Problems in relationship with spouse or partner: Secondary | ICD-10-CM

## 2017-08-15 LAB — GAMMA GT: GGT: 36 U/L (ref 7–50)

## 2017-08-15 LAB — HEPATIC FUNCTION PANEL
ALBUMIN: 4.3 g/dL (ref 3.5–5.0)
ALT: 31 U/L (ref 0–44)
AST: 28 U/L (ref 15–41)
Alkaline Phosphatase: 68 U/L (ref 38–126)
Bilirubin, Direct: 0.1 mg/dL (ref 0.0–0.2)
Indirect Bilirubin: 0.4 mg/dL (ref 0.3–0.9)
TOTAL PROTEIN: 7.5 g/dL (ref 6.5–8.1)
Total Bilirubin: 0.5 mg/dL (ref 0.3–1.2)

## 2017-08-15 LAB — LIPID PANEL
CHOL/HDL RATIO: 3 ratio
Cholesterol: 179 mg/dL (ref 0–200)
HDL: 60 mg/dL (ref >40)
LDL CALC: 88 mg/dL (ref 0–99)
TRIGLYCERIDES: 153 mg/dL — AB (ref <150)
VLDL: 31 mg/dL (ref 0–40)

## 2017-08-15 LAB — HEMOGLOBIN A1C
HEMOGLOBIN A1C: 6.4 % — AB (ref 4.8–5.6)
MEAN PLASMA GLUCOSE: 136.98 mg/dL

## 2017-08-15 LAB — GLUCOSE, CAPILLARY: GLUCOSE-CAPILLARY: 127 mg/dL — AB (ref 70–99)

## 2017-08-15 MED ORDER — TRAZODONE HCL 50 MG PO TABS
50.0000 mg | ORAL_TABLET | Freq: Every evening | ORAL | Status: DC | PRN
  Administered 2017-08-15: 50 mg via ORAL
  Filled 2017-08-15 (×7): qty 1

## 2017-08-15 MED ORDER — TRAZODONE HCL 50 MG PO TABS
50.0000 mg | ORAL_TABLET | Freq: Every day | ORAL | Status: DC
  Filled 2017-08-15 (×2): qty 1

## 2017-08-15 MED ORDER — ZOLPIDEM TARTRATE 5 MG PO TABS
5.0000 mg | ORAL_TABLET | Freq: Every evening | ORAL | Status: DC | PRN
  Filled 2017-08-15: qty 1

## 2017-08-15 NOTE — H&P (Signed)
Psychiatric Admission Assessment Adult  Patient Identification: Kendra LifeJennifer S Riggio MRN:  [id number] Date of Evaluation:  08/15/2017 Chief Complaint:  MDD REC SEV WITHOUT PSYCHOTIC FEATURES Principal Diagnosis: <principal problem not specified> Diagnosis:   Patient Active Problem List   Diagnosis Date Noted  . Major depression, recurrent, chronic (HCC) [F33.9] 08/14/2017  . Type II or unspecified type diabetes mellitus without mention of complication, not stated as uncontrolled [E11.9] 03/03/2010  . HYPERCHOLESTEROLEMIA [E78.00] 03/03/2010  . HYPERTENSION [I10] 03/03/2010  . MENOPAUSE-RELATED VASOMOTOR SYMPTOMS, HOT FLASHES [N95.1] 03/03/2010  . OSTEOPENIA [M89.9, M94.9] 03/03/2010  . FATIGUE [R53.81, R53.83] 03/03/2010  . PAP SMEAR, ABNORMAL [R87.619] 03/03/2010   History of Present Illness: Patient is seen and examined.  Patient is a 62 year old female with a past psychiatric history significant for major depression who presented to the [facility] emergency department the day prior to admission with suicidal ideation.  The patient stated that her brother had been recently ill and chronically deteriorated over the last year.  He died in February of this year.  She had visited him on a regular occasion prior to his death, but had some back problems and was unable to go the weekend that he died.  She has had great deal with this.  She also had an argument with her daughter the day of admission, and things just got worse.  She is also the executor of his brothers will, and had to arrange all the funeral issues.  She was also going to have to go to his house and clean it out.  This was overwhelming for her.  She had been treated for several years with sertraline between the doses of 50 and 100 mg a day.  She felt as though this is been beneficial in the past.  She was tearful, helpless, hopeless and worthless.  On day 2 of the hospitalization she is doing better.  Her mood is improved.  She slept well  last night.  She stated she had a conversation with her husband (whom she feels had not been greatly supportive) and he was quite tearful during the interview.  This helped her feel somewhat better.  She is tolerated the Zoloft increase.  She denied any suicidal ideation today. Associated Signs/Symptoms: Depression Symptoms:  depressed mood, anhedonia, insomnia, psychomotor agitation, fatigue, feelings of worthlessness/guilt, difficulty concentrating, hopelessness, suicidal thoughts without plan, anxiety, loss of energy/fatigue, disturbed sleep, (Hypo) Manic Symptoms:  Impulsivity, Anxiety Symptoms:  Excessive Worry, Psychotic Symptoms:  Denied PTSD Symptoms: Negative Total Time spent with patient: 45 minutes  Past Psychiatric History: Patient is had a history of depression is been successfully treated with Zoloft as an outpatient.  She is never been admitted to the hospital.  She has seen a psychiatrist for med management, but is interested in having a therapist.  Is the patient at risk to self? No.  Has the patient been a risk to self in the past 6 months? No.  Has the patient been a risk to self within the distant past? No.  Is the patient a risk to others? No.  Has the patient been a risk to others in the past 6 months? No.  Has the patient been a risk to others within the distant past? No.   Prior Inpatient Therapy:   Prior Outpatient Therapy:    Alcohol Screening: 1. How often do you have a drink containing alcohol?: Monthly or less 2. How many drinks containing alcohol do you have on a typical day when you are  drinking?: 1 or 2 3. How often do you have six or more drinks on one occasion?: Never AUDIT-C Score: 1 4. How often during the last year have you found that you were not able to stop drinking once you had started?: Never 5. How often during the last year have you failed to do what was normally expected from you becasue of drinking?: Never 6. How often during the  last year have you needed a first drink in the morning to get yourself going after a heavy drinking session?: Never 7. How often during the last year have you had a feeling of guilt of remorse after drinking?: Never 8. How often during the last year have you been unable to remember what happened the night before because you had been drinking?: Never 9. Have you or someone else been injured as a result of your drinking?: No 10. Has a relative or friend or a doctor or another health worker been concerned about your drinking or suggested you cut down?: No Alcohol Use Disorder Identification Test Final Score (AUDIT): 1 Substance Abuse History in the last 12 months:  Yes.   Consequences of Substance Abuse: Negative Previous Psychotropic Medications: Yes  Psychological Evaluations: No  Past Medical History:  Past Medical History:  Diagnosis Date  . Anxiety   . Arthritis   . Asthma   . Cataracts, bilateral   . Depression   . Diabetes type 2, controlled (HCC)   . Hyperlipidemia   . Seasonal allergies     Past Surgical History:  Procedure Laterality Date  . BLEPHAROPLASTY  2009  . CATARACT EXTRACTION, BILATERAL  2011, 2013  . lumbar disc removal  1991   Family History:  Family History  Problem Relation Age of Onset  . Breast cancer Paternal Grandmother   . Colon cancer Neg Hx   . Esophageal cancer Neg Hx   . Rectal cancer Neg Hx   . Prostate cancer Neg Hx    Family Psychiatric  History: She felt as though her brother had been depressed. Tobacco Screening: Have you used any form of tobacco in the last 30 days? (Cigarettes, Smokeless Tobacco, Cigars, and/or Pipes): No Social History:  Social History   Substance and Sexual Activity  Alcohol Use Yes  . Alcohol/week: 0.0 oz   Comment: rare     Social History   Substance and Sexual Activity  Drug Use No    Additional Social History: Marital status: Married Number of Years Married: 38 What types of issues is patient dealing  with in the relationship?: Patient reports she and her husband have a strained relationship currently. She states they can work on their conversation Additional relationship information: N/A Are you sexually active?: Yes What is your sexual orientation?: Heterosexual  Has your sexual activity been affected by drugs, alcohol, medication, or emotional stress?: No Does patient have children?: Yes How many children?: 1 How is patient's relationship with their children?: Patient reports having a good relationship with her only daughter who is 48 years old.                          Allergies:  No Known Allergies Lab Results:  Results for orders placed or performed during the hospital encounter of 08/14/17 (from the past 48 hour(s))  Glucose, capillary     Status: None   Collection Time: 08/14/17  4:58 PM  Result Value Ref Range   Glucose-Capillary 99 70 - 99 mg/dL  Comment 1 Notify RN    Comment 2 Document in Chart   Glucose, capillary     Status: Abnormal   Collection Time: 08/15/17  6:25 AM  Result Value Ref Range   Glucose-Capillary 127 (H) 70 - 99 mg/dL  Gamma GT     Status: None   Collection Time: 08/15/17  6:48 AM  Result Value Ref Range   GGT 36 7 - 50 U/L    Comment: Performed at [facility], [address], [place], [place] [address]  Hemoglobin A1c     Status: Abnormal   Collection Time: 08/15/17  6:48 AM  Result Value Ref Range   Hgb A1c MFr Bld 6.4 (H) 4.8 - 5.6 %    Comment: (NOTE) Pre diabetes:          5.7%-6.4% Diabetes:              >6.4% Glycemic control for   <7.0% adults with diabetes    Mean Plasma Glucose 136.98 mg/dL    Comment: Performed at [facility], [address], [place], [place] [address]  Hepatic function panel     Status: None   Collection Time: 08/15/17  6:48 AM  Result Value Ref Range   Total Protein 7.5 6.5 - 8.1 g/dL   Albumin 4.3 3.5 - 5.0 g/dL   AST 28 15 - 41 U/L   ALT 31 0 - 44 U/L    Comment: Please note  change in reference range.   Alkaline Phosphatase 68 38 - 126 U/L   Total Bilirubin 0.5 0.3 - 1.2 mg/dL   Bilirubin, Direct 0.1 0.0 - 0.2 mg/dL    Comment: Please note change in reference range.   Indirect Bilirubin 0.4 0.3 - 0.9 mg/dL    Comment: Performed at [facility], [address] [place], [place] [address]  Lipid panel     Status: Abnormal   Collection Time: 08/15/17  6:48 AM  Result Value Ref Range   Cholesterol 179 0 - 200 mg/dL   Triglycerides 696 (H) <150 mg/dL   HDL 60 >29 mg/dL   Total CHOL/HDL Ratio 3.0 RATIO   VLDL 31 0 - 40 mg/dL   LDL Cholesterol 88 0 - 99 mg/dL    Comment:        Total Cholesterol/HDL:CHD Risk Coronary Heart Disease Risk Table                     Men   Women  1/2 Average Risk   3.4   3.3  Average Risk       5.0   4.4  2 X Average Risk   9.6   7.1  3 X Average Risk  23.4   11.0        Use the calculated Patient Ratio above and the CHD Risk Table to determine the patient's CHD Risk.        ATP III CLASSIFICATION (LDL):  <100     mg/dL   Optimal  528-413  mg/dL   Near or Above                    Optimal  130-159  mg/dL   Borderline  244-010  mg/dL   High  >272     mg/dL   Very High Performed at [facility], [address] [place], [place] [address]     Blood Alcohol level:  Lab Results  Component Value  Date   ETH 114 (H) 08/13/2017    Metabolic Disorder Labs:  Lab Results  Component Value Date   HGBA1C 6.4 (H) 08/15/2017   MPG 136.98 08/15/2017   No results found for: PROLACTIN Lab Results  Component Value Date   CHOL 179 08/15/2017   TRIG 153 (H) 08/15/2017   HDL 60 08/15/2017   CHOLHDL 3.0 08/15/2017   VLDL 31 08/15/2017   LDLCALC 88 08/15/2017    Current Medications: Current Facility-Administered Medications  Medication Dose Route Frequency Provider Last Rate Last Dose  . acetaminophen (TYLENOL) tablet 650 mg  650 mg Oral Q6H PRN Antonieta Pert, MD      . alum & mag  hydroxide-simeth (MAALOX/MYLANTA) 200-200-20 MG/5ML suspension 30 mL  30 mL Oral Q4H PRN Antonieta Pert, MD      . cyclobenzaprine (FLEXERIL) tablet 5 mg  5 mg Oral TID PRN Antonieta Pert, MD      . folic acid (FOLVITE) tablet 1 mg  1 mg Oral Daily Antonieta Pert, MD   1 mg at 08/15/17 0754  . hydrOXYzine (ATARAX/VISTARIL) tablet 25 mg  25 mg Oral Q6H PRN Antonieta Pert, MD      . lisinopril (PRINIVIL,ZESTRIL) tablet 5 mg  5 mg Oral Daily Antonieta Pert, MD   5 mg at 08/15/17 0754  . LORazepam (ATIVAN) tablet 0.5 mg  0.5 mg Oral Q6H PRN Antonieta Pert, MD      . magnesium hydroxide (MILK OF MAGNESIA) suspension 30 mL  30 mL Oral Daily PRN Antonieta Pert, MD      . metFORMIN (GLUCOPHAGE) tablet 1,000 mg  1,000 mg Oral BID WC Antonieta Pert, MD   1,000 mg at 08/15/17 0754  . naproxen (NAPROSYN) tablet 500 mg  500 mg Oral BID PRN Antonieta Pert, MD      . omega-3 acid ethyl esters (LOVAZA) capsule 1 g  1 g Oral QID Antonieta Pert, MD   1 g at 08/15/17 1157  . rosuvastatin (CRESTOR) tablet 10 mg  10 mg Oral q1800 Antonieta Pert, MD   10 mg at 08/14/17 1711  . sertraline (ZOLOFT) tablet 150 mg  150 mg Oral Daily Antonieta Pert, MD   150 mg at 08/15/17 0753  . thiamine (VITAMIN B-1) tablet 100 mg  100 mg Oral Daily Antonieta Pert, MD   100 mg at 08/15/17 0753  . traMADol (ULTRAM) tablet 50 mg  50 mg Oral Q6H PRN Antonieta Pert, MD      . traZODone (DESYREL) tablet 50 mg  50 mg Oral QHS Antonieta Pert, MD      . zolpidem [facility]) tablet 5 mg  5 mg Oral QHS PRN Antonieta Pert, MD       PTA Medications: Medications Prior to Admission  Medication Sig Dispense Refill Last Dose  . albuterol (PROAIR HFA) 108 (90 Base) MCG/ACT inhaler Inhale 1-2 puffs into the lungs every 4 (four) hours as needed for wheezing or shortness of breath.   year ago  . Cholecalciferol (VITAMIN D3 PO) Take 1 tablet by mouth at bedtime.   04/06/2017 at pm  .  clotrimazole-betamethasone (LOTRISONE) cream Apply 1 application topically daily as needed (psoriasis/eczema).   month ago  . cyclobenzaprine (FLEXERIL) 10 MG tablet Take 1 tablet (10 mg total) by mouth 2 (two) times daily as needed for muscle spasms. 20 tablet 0   . Fexofenadine HCl (ALLEGRA PO) Take 1 tablet by mouth  daily.   04/06/2017 at Unknown time  . lisinopril (PRINIVIL,ZESTRIL) 5 MG tablet Take 5 mg by mouth at bedtime.    04/06/2017 at pm  . metFORMIN (GLUCOPHAGE) 1000 MG tablet Take 1,000 mg by mouth 2 (two) times daily.  3 04/07/2017 at am  . Multiple Vitamins-Minerals (HAIR/SKIN/NAILS/BIOTIN) TABS Take 1 tablet by mouth at bedtime.   04/06/2017 at pm  . naproxen (NAPROSYN) 500 MG tablet Take 500 mg by mouth 4 (four) times daily as needed (pain).    04/07/2017 at am  . omega-3 acid ethyl esters (LOVAZA) 1 G capsule Take 4 g by mouth at bedtime.    04/06/2017 at pm  . Polyethyl Glycol-Propyl Glycol (SYSTANE OP) Place 1 drop into both eyes daily as needed (dry eyes).   few days ago  . rosuvastatin (CRESTOR) 10 MG tablet Take 10 mg by mouth at bedtime.    04/06/2017 at pm  . sertraline (ZOLOFT) 100 MG tablet Take 50 mg by mouth at bedtime.    04/06/2017 at pm  . traMADol (ULTRAM) 50 MG tablet Take 50 mg by mouth every 6 (six) hours as needed (pain).   04/07/2017 at am  . zolpidem (AMBIEN) 10 MG tablet Take 10 mg by mouth at bedtime.    04/06/2017 at pm    Musculoskeletal: Strength & Muscle Tone: within normal limits Gait & Station: normal Patient leans: N/A  Psychiatric Specialty Exam: Physical Exam  Nursing note and vitals reviewed. Constitutional: She is oriented to person, place, and time. She appears well-developed and well-nourished.  HENT:  Head: Normocephalic and atraumatic.  Respiratory: Effort normal.  Neurological: She is alert and oriented to person, place, and time.    ROS  Blood pressure 114/87, pulse 75, temperature 98 F (36.7 C), temperature source Oral, resp. rate 18,  height 5\' 5"  (1.651 m), weight 64.4 kg (142 lb), SpO2 95 %.Body mass index is 23.63 kg/m.  General Appearance: Casual  Eye Contact:  Fair  Speech:  Normal Rate  Volume:  Normal  Mood:  Depressed  Affect:  Congruent  Thought Process:  Coherent  Orientation:  Full (Time, Place, and Person)  Thought Content:  Logical  Suicidal Thoughts:  No  Homicidal Thoughts:  No  Memory:  Immediate;   Fair Recent;   Fair Remote;   Fair  Judgement:  Intact  Insight:  Fair  Psychomotor Activity:  Normal  Concentration:  Concentration: Fair and Attention Span: Fair  Recall:  [employer] of Knowledge:  Fair  Language:  Fair  Akathisia:  Negative  Handed:  Right  AIMS (if indicated):     Assets:  Desire for Improvement Financial Resources/Insurance Housing Physical Health Resilience Social Support  ADL's:  Intact  Cognition:  WNL  Sleep:  Number of Hours: 5.5    Treatment Plan Summary: Daily contact with patient to assess and evaluate symptoms and progress in treatment, Medication management and Plan The patient was admitted to the hospital.  She was started on increased Zoloft at 150 mg p.o. daily.  She is tolerated that.  We also discussed her alcohol intake.  We discussed that the alcohol is a depressant.  She plans on cutting back on that.  She has requested that she get a therapist after discharge, and we will arrange that.  No other changes in her medications and treatment today.  Observation Level/Precautions:  15 minute checks  Laboratory:  Chemistry Profile  Psychotherapy:    Medications:    Consultations:    Discharge  Concerns:    Estimated LOS:  Other:     Physician Treatment Plan for Primary Diagnosis: <principal problem not specified> Long Term Goal(s): Improvement in symptoms so as ready for discharge  Short Term Goals: Ability to identify changes in lifestyle to reduce recurrence of condition will improve, Ability to verbalize feelings will improve, Ability to disclose and  discuss suicidal ideas, Ability to demonstrate self-control will improve, Ability to identify and develop effective coping behaviors will improve and Ability to maintain clinical measurements within normal limits will improve  Physician Treatment Plan for Secondary Diagnosis: Active Problems:   Major depression, recurrent, chronic (HCC)  Long Term Goal(s): Improvement in symptoms so as ready for discharge  Short Term Goals: Ability to identify changes in lifestyle to reduce recurrence of condition will improve, Ability to verbalize feelings will improve, Ability to disclose and discuss suicidal ideas, Ability to demonstrate self-control will improve, Ability to identify and develop effective coping behaviors will improve and Ability to maintain clinical measurements within normal limits will improve  I certify that inpatient services furnished can reasonably be expected to improve the patient's condition.    Antonieta Pert, MD 7/4/20191:33 PM

## 2017-08-15 NOTE — Progress Notes (Signed)
Writer assume care for Pt. Pt seen resting in bed with eyes closed. Respirations even and unlabored.

## 2017-08-15 NOTE — Plan of Care (Signed)
D: Pt denies SI/HI/AVH. Pt is pleasant and cooperative. Pt stated she was feeling better, spirits better and hopeful about the future.   A: Pt was offered support and encouragement. Pt was given scheduled medications. Pt was encourage to attend groups. Q 15 minute checks were done for safety.   R:Pt attends groups and interacts well with peers and staff. Pt is taking medication. Pt has no complaints.Pt receptive to treatment and safety maintained on unit.   Problem: Education: Goal: Emotional status will improve Outcome: Progressing   Problem: Education: Goal: Mental status will improve Outcome: Progressing   Problem: Coping: Goal: Ability to identify and develop effective coping behavior will improve Outcome: Progressing

## 2017-08-15 NOTE — Progress Notes (Signed)
Adult Psychoeducational Group Note  Date:  08/15/2017 Time:  10:11 PM  Group Topic/Focus:  Wrap-Up Group:   The focus of this group is to help patients review their daily goal of treatment and discuss progress on daily workbooks.  Participation Level:  Active  Participation Quality:  Appropriate  Affect:  Appropriate  Cognitive:  Appropriate  Insight: Appropriate  Engagement in Group:  Engaged  Modes of Intervention:  Discussion  Additional Comments:  Pt expressed that she did not sleep well.  Pt stated that emotionally she good but was bored throughout the day.  Pt rated the day at 8/10.  Murline Weigel 08/15/2017, 10:11 PM

## 2017-08-15 NOTE — Progress Notes (Signed)
Patient ID: Kendra [name], female   DOB: May 17, 1955, 62 y.o.   MRN: [id number]  Nursing Progress Note [id number]  Data: Patient presents with anxious mood but is pleasant and cooperative with Clinical research associatewriter. Patient observed interacting with peers in the dayroom. Patient complaint with scheduled medications. Patient denies pain/physical complaints. Patient completed self-inventory sheet and rates depression, hopelessness, and anxiety 2,2,2 respectively. Patient rates their sleep and appetite as poor/fair respectively. Patient states goal for today is to "be happy and not think about the things I cannot change". Patient is seen attending groups and visible in the milieu. Patient currently denies SI/HI/AVH.   Action: Patient educated about and provided medication per provider's orders. Patient safety maintained with q15 min safety checks and frequent rounding. Low fall risk precautions in place. Emotional support given. 1:1 interaction and active listening provided. Patient encouraged to attend meals and groups. Patient encouraged to work on treatment plan and goals. Labs, vital signs and patient behavior monitored throughout shift.   Response: Patient agrees to come to staff if any thoughts of SI/HI develop or if patient develops intention of acting on thoughts. Patient remains safe on the unit at this time. Patient is interacting with peers appropriately on the unit. Will continue to support and monitor.

## 2017-08-15 NOTE — [facility] Counselor (Signed)
Adult Comprehensive Assessment  Patient ID: Kendra [name], female   DOB: 1955-11-02, 62 y.o.   MRN: [id number]  Information Source: Information source: Patient  Current Stressors:  Patient states their primary concerns and needs for treatment are:: "I struggle with depression" Patient states their goals for this hospitilization and ongoing recovery are:: "Stabilized on medications and therapy" Educational / Learning stressors: Patient denies any stressors  Employment / Job issues: [employer] Family Relationships: Patient reports she and her husband have some communication Artistissues  Financial / Lack of resources (include bankruptcy): Patient reports she receives a pension and has a lot of investments; Patient reports she is financially comfortable Housing / Lack of housing: Patient [place] living [place] in [place], [place] Physical health (include injuries & [name] threatening diseases): Patient denies any stressors  Bereavement / Loss: Patient reports losing a lot of family members and friends over the last 4 years. Patient reports her brother passed away this year. Patient reports she continues to struggle with the death of loved ones.   Living/Environment/Situation:  Living Arrangements: Spouse/significant other Living conditions (as described by patient or guardian): "Comfortable  and safe" Who else lives in the home?: Husband  How long has patient lived in current situation?: 26 years  What is atmosphere in current home: Comfortable, Loving  Family History:  Marital status: Married Number of Years Married: 38 What types of issues is patient dealing with in the relationship?: Patient reports she and her husband have a strained relationship currently. She states they can work on their conversation Additional relationship information: N/A Are you sexually active?: Yes What is your sexual orientation?: Heterosexual  Has your sexual activity been affected by drugs, alcohol, medication, or  emotional stress?: No Does patient have children?: Yes How many children?: 1 How is patient's relationship with their children?: Patient reports having a good relationship with her only daughter who is 342 years old.   Childhood History:  By whom was/is the patient raised?: Mother, Mother/father and step-parent Additional childhood history information: Patient reports her father passed away when she was 62 years old. Patient reports her mother remarried when she was 62 years old.  Description of patient's relationship with caregiver when they were a child: Patient reports having a good relationship with her mother and step-father growing up, although her step father was very strict.  Patient's description of current relationship with people who raised him/her: Patient reports both of her biological parents and her step father are currently deceased.  How were you disciplined when you got in trouble as a child/adolescent?: Restrictions, Verbal discipline; "guilt-tripped" Does patient have siblings?: Yes Number of Siblings: 3 Description of patient's current relationship with siblings: Patient reports having a very close relationship with her three siblings. Two sisters and 1 brother.  Did patient suffer any verbal/emotional/physical/sexual abuse as a child?: No Did patient suffer from severe childhood neglect?: No Has patient ever been sexually abused/assaulted/raped as an adolescent or adult?: No Was the patient ever a victim of a crime or a disaster?: No Witnessed domestic violence?: No Has patient been effected by domestic violence as an adult?: No  Education:  Highest grade of school patient has completed: 12th grade; Associate's degree Currently a student?: No Learning disability?: No  Employment/Work Situation:   Employment situation: Retired Psychologist, clinicalatient's job has been impacted by current illness: No What is the longest time patient has a held a job?: 35 years  Where was the 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  If attempted suicide, did drugs/alcohol play a role in this?: No Alcohol/Substance Abuse Treatment Hx: Denies past history Has alcohol/substance abuse ever caused legal problems?: No  Social Support System:   Patient's Community Support System: Fair Describe Community Support System: "I dont know if I have a support system, because I am not comfortable asking for help and support" Type of faith/religion: Christianity  How does patient's faith help to cope with current illness?: Prayer   Leisure/Recreation:   Leisure and Hobbies: "I read a lot, I love to read; Gardening, Drawing/Paint"   Strengths/Needs:   What is the patient's perception of their strengths?: "I'm organized, good planner, understanding" Patient states they can use these personal strengths during their treatment to contribute to their recovery: Yes Patient states these barriers may affect/interfere with their treatment: No Patient states these barriers may affect their return to the community: No Other important information patient would like considered in planning for their treatment: No  Discharge Plan:   Currently receiving community mental health services: Yes (From Whom)(Dr. Burdine at [facility] in [place], [place]. ) Patient states concerns and preferences for aftercare planning are: "I would like an outpatient therapist" Patient states they will know when they are safe and ready for discharge when: Yes  Does patient have access to transportation?: Yes Does patient have financial  barriers related to discharge medications?: No Will patient be returning to same living situation after discharge?: Yes  Summary/Recommendations:   Summary and Recommendations (to be completed by the evaluator): Lavinia is a 62 year old female who is diagnosed with Major Depressive disorder. She presented to the hospital seeking treatment for worsening depressive symptoms and suicidal ideation with a plan to overdose on Ambien. Kaleigha was pleasant and cooperative with providing information.  Tylin reports that she believes she struggles with depression due to dealing with a lot of deathf os loved ones. Tyreshia reports that she would be interested in outpatient therapy at discharge. Clyde reports that she plans to return home with her husband at discharge. Deklynn reports [facility] Leandrew [facility]anagi at [facility] provides her medications, however she would like to be referred to a therapist for additional support. Abel can benefit from crisis stabilization, medication management, therapeutic milieu and referral services.   Maeola Sarah. 08/15/2017

## 2017-08-15 NOTE — Progress Notes (Signed)
D: Patient denies SI, HI or AVH.  Pt. Presents as depressed and anxious, stating that she had a horrible start to her day but reports as the day has gone on it has gotten better.  Pt. States she woke up this morning and didn't know where she was, states that the last thing she remembered was "a bunch of people standing over me".  Pt. States that she does realize her drinking is an issue and that she should not be drinking while taking Ambien.  Pt. Reports a lot of grief and loss issues in the past couple of years with no treatment and a lot of responsibility in general.  Pt. States that she wants to get better but has another uncle whose health is rapidly failing.  Pt. Is visualized interacting with staff and others on the unit, she attended and participated in evening wrap up group.  A: Patient given emotional support from RN. Patient encouraged to come to staff with concerns and/or questions. Patient's medication routine continued. Patient's orders and plan of care reviewed.   R: Patient remains appropriate and cooperative. Will continue to monitor patient q15 minutes for safety.

## 2017-08-15 NOTE — Progress Notes (Signed)
Adult Psychoeducational Group Note  Date:  08/15/2017 Time:  1:08 AM  Group Topic/Focus:  Wrap-Up Group:   The focus of this group is to help patients review their daily goal of treatment and discuss progress on daily workbooks.  Participation Level:  Active  Participation Quality:  Appropriate  Affect:  Appropriate  Cognitive:  Appropriate  Insight: Appropriate  Engagement in Group:  Engaged  Modes of Intervention:  Discussion  Additional Comments:  Pt expressed that she had a crappy day due to feeling depressed.  Pt expressed that she misses her dog with whom she thinks of like her own child.  Pt rated her day at 2/10.  Desa Rech 08/15/2017, 1:08 AM

## 2017-08-16 LAB — GLUCOSE, CAPILLARY: Glucose-Capillary: 123 mg/dL — ABNORMAL HIGH (ref 70–99)

## 2017-08-16 MED ORDER — TRAZODONE HCL 50 MG PO TABS: 50.0000 mg | ORAL_TABLET | Freq: Every evening | ORAL | 0 refills | Status: DC | PRN

## 2017-08-16 MED ORDER — SERTRALINE HCL 50 MG PO TABS: 150.0000 mg | ORAL_TABLET | Freq: Every day | ORAL | 0 refills | Status: DC

## 2017-08-16 MED ORDER — HYDROXYZINE HCL 25 MG PO TABS: 25.0000 mg | ORAL_TABLET | Freq: Four times a day (QID) | ORAL | 0 refills | Status: DC | PRN

## 2017-08-16 NOTE — [facility] Suicide Risk Assessment (Signed)
[facility]   Principal Problem: <principal problem not specified> Discharge Diagnoses:  Patient Active Problem List   Diagnosis Date Noted  . Major depression, recurrent, chronic (HCC) [F33.9] 08/14/2017  . Type II or unspecified type diabetes mellitus without mention of complication, not stated as uncontrolled [E11.9] 03/03/2010  . HYPERCHOLESTEROLEMIA [E78.00] 03/03/2010  . HYPERTENSION [I10] 03/03/2010  . MENOPAUSE-RELATED VASOMOTOR SYMPTOMS, HOT FLASHES [N95.1] 03/03/2010  . OSTEOPENIA [M89.9, M94.9] 03/03/2010  . FATIGUE [R53.81, R53.83] 03/03/2010  . PAP SMEAR, ABNORMAL [R87.619] 03/03/2010    Total Time spent with patient: 30 minutes  Musculoskeletal: Strength & Muscle Tone: within normal limits Gait & Station: normal Patient leans: N/A  Psychiatric Specialty Exam: Review of Systems  All other systems reviewed and are negative.   Blood pressure 114/87, pulse 75, temperature 98 F (36.7 C), temperature source Oral, resp. rate 18, height 5\' 5"  (1.651 m), weight 64.4 kg (142 lb), SpO2 95 %.Body mass index is 23.63 kg/m.  General Appearance: Casual  Eye Contact::  Good  Speech:  Clear and Coherent409  Volume:  Normal  Mood:  Euthymic  Affect:  Appropriate  Thought Process:  Coherent  Orientation:  Full (Time, Place, and Person)  Thought Content:  Logical  Suicidal Thoughts:  No  Homicidal Thoughts:  No  Memory:  Immediate;   Fair Recent;   Fair Remote;   Fair  Judgement:  Intact  Insight:  Fair  Psychomotor Activity:  Normal  Concentration:  Good  Recall:  Good  Fund of Knowledge:Good  Language: Good  Akathisia:  Negative  Handed:  Right  AIMS (if indicated):     Assets:  Communication Skills Desire for Improvement Financial Resources/Insurance Housing Intimacy Leisure Time Physical Health Resilience Social Support  Sleep:  Number of Hours: 6.75  Cognition: WNL  ADL's:  Intact   Mental Status Per Nursing Assessment::    On Admission:  NA  Demographic Factors:  Caucasian  Loss Factors: NA  Historical Factors: Impulsivity  Risk Reduction Factors:   Sense of responsibility to family, Religious beliefs about death, Living with another person, especially a relative, Positive social support and Positive coping skills or problem solving skills  Continued Clinical Symptoms:  Depression:   Impulsivity  Cognitive Features That Contribute To Risk:  None    Suicide Risk:  Minimal: No identifiable suicidal ideation.  Patients presenting with no risk factors but with morbid ruminations; may be classified as minimal risk based on the severity of the depressive symptoms    Plan Of Care/Follow-up recommendations:  Activity:  ad lib  Antonieta PertGreg Lawson Clary, MD 08/16/2017, 7:41 AM

## 2017-08-16 NOTE — Progress Notes (Signed)
Patient ID: Kendra [name], female   DOB: May 05, 1955, 62 y.o.   MRN: [id number]  Discharge Note  D) Patient discharged to lobby. Patient states readiness for discharge. Patient denies SI/HI, AVH and is not delusional or psychotic. Patient in no acute distress.   A) Written and verbal discharge instructions given to the patient. Patient accepting to information and verbalized understanding. Patient agrees to the discharge plan. Opportunity for questions and concerns presented to patient. Patient denied any further questions or concerns. All belongings returned to patient. Patient signed for return of belongings and discharge paperwork.   R) Patient safely escorted to the lobby. [facility], personal belongings, follow-up appointment in place and discharge paperwork.

## 2017-08-16 NOTE — [facility] Suicide Risk Assessment (Signed)
[facility] INPATIENT:  Family/Significant Other Suicide Prevention Education  Suicide Prevention Education:  Education Completed; Francisco CapuchinJohn Rueda [phone]) has been identified by the patient as the family member/significant other with whom the patient will be residing, and identified as the person(s) who will aid the patient in the event of a mental health crisis (suicidal ideations/suicide attempt).  With written consent from the patient, the family member/significant other has been provided the following suicide prevention education, prior to the and/or following the discharge of the patient.  The suicide prevention education provided includes the following:  Suicide risk factors  Suicide prevention and interventions  National Suicide Hotline telephone number  [facility] assessment telephone number  [facility] Emergency Assistance 911  [facility] and/or Residential Mobile Crisis Unit telephone number  Request made of family/significant other to:  Remove weapons (e.g., guns, rifles, knives), all items previously/currently identified as safety concern.    Remove drugs/medications (over-the-counter, prescriptions, illicit drugs), all items previously/currently identified as a safety concern.  The family member/significant other verbalizes understanding of the suicide prevention education information provided.  The family member/significant other agrees to remove the items of safety concern listed above.  Maeola SarahJolan E Milani Lowenstein 08/16/2017, 9:39 AM

## 2017-08-16 NOTE — Discharge Summary (Addendum)
Physician Discharge Summary Note  Patient:  Kendra [name] is an 62 y.o., female MRN:  [id number] DOB:  1955/08/12 Patient phone:  [phone] (home)  Patient address:   [address] [place] [address],  Total Time spent with patient: 30 minutes  Date of Admission:  08/14/2017 Date of Discharge: 08/16/2017  Reason for Admission: Patient is seen and examined.  Patient is a 62 year old female with a past psychiatric history significant for major depression who presented to the [facility] emergency department the day prior to admission with suicidal ideation.  The patient stated that her brother had been recently ill and chronically deteriorated over the last year.  He died in 2022/04/05 of this year.  She had visited him on a regular occasion prior to his death, but had some back problems and was unable to go the weekend that he died.  She has had great deal with this.  She also had an argument with her daughter the day of admission, and things just got worse.  She is also the executor of his brothers will, and had to arrange all the funeral issues.  She was also going to have to go to his house and clean it out.  This was overwhelming for her.  She had been treated for several years with sertraline between the doses of 50 and 100 mg a day.  She felt as though this is been beneficial in the past.  She was tearful, helpless, hopeless and worthless.  On day 2 of the hospitalization she is doing better.  Her mood is improved.  She slept well last night.  She stated she had a conversation with her husband (whom she feels had not been greatly supportive) and he was quite tearful during the interview.  This helped her feel somewhat better.  She is tolerated the Zoloft increase.  She denied any suicidal ideation today.   Associated Signs/Symptoms: Depression Symptoms:  depressed mood, anhedonia, insomnia, psychomotor agitation, fatigue, feelings of worthlessness/guilt, difficulty  concentrating, hopelessness, suicidal thoughts without plan, anxiety, loss of energy/fatigue, disturbed sleep, (Hypo) Manic Symptoms:  Impulsivity, Anxiety Symptoms:  Excessive Worry, Psychotic Symptoms:  Denied PTSD Symptoms: Negative  Past Psychiatric History: Patient is had a history of depression is been successfully treated with Zoloft as an outpatient.  She is never been admitted to the hospital.  She has seen a psychiatrist for med management, but is interested in having a therapist.  Principal Problem: Major depression, recurrent, chronic [facility]) Discharge Diagnoses: Patient Active Problem List   Diagnosis Date Noted  . Major depression, recurrent, chronic (HCC) [F33.9] 08/14/2017  . Type II or unspecified type diabetes mellitus without mention of complication, not stated as uncontrolled [E11.9] 03/03/2010  . HYPERCHOLESTEROLEMIA [E78.00] 03/03/2010  . HYPERTENSION [I10] 03/03/2010  . MENOPAUSE-RELATED VASOMOTOR SYMPTOMS, HOT FLASHES [N95.1] 03/03/2010  . OSTEOPENIA [M89.9, M94.9] 03/03/2010  . FATIGUE [R53.81, R53.83] 03/03/2010  . PAP SMEAR, ABNORMAL [R87.619] 03/03/2010   Past Medical History:  Past Medical History:  Diagnosis Date  . Anxiety   . Arthritis   . Asthma   . Cataracts, bilateral   . Depression   . Diabetes type 2, controlled (HCC)   . Hyperlipidemia   . Seasonal allergies     Past Surgical History:  Procedure Laterality Date  . BLEPHAROPLASTY  2009  . CATARACT EXTRACTION, BILATERAL  2011, 2013  . lumbar disc removal  1991   Family History:  Family History  Problem Relation Age of Onset  . Breast cancer Paternal  Grandmother   . Colon cancer Neg Hx   . Esophageal cancer Neg Hx   . Rectal cancer Neg Hx   . Prostate cancer Neg Hx    Family Psychiatric  History: She felt as though her brother had been depressed  Social History:  Social History   Substance and Sexual Activity  Alcohol Use Yes  . Alcohol/week: 0.0 oz   Comment: rare      Social History   Substance and Sexual Activity  Drug Use No    Social History   Socioeconomic History  . Marital status: Single    Spouse name: Not on file  . Number of children: Not on file  . Years of education: Not on file  . Highest education level: Not on file  Occupational History  . Not on file  Social Needs  . Financial resource strain: Not on file  . Food insecurity:    Worry: Not on file    Inability: Not on file  . Transportation needs:    Medical: Not on file    Non-medical: Not on file  Tobacco Use  . Smoking status: Former Smoker    Last attempt to quit: 02/12/1981    Years since quitting: 36.5  . Smokeless tobacco: Never Used  Substance and Sexual Activity  . Alcohol use: Yes    Alcohol/week: 0.0 oz    Comment: rare  . Drug use: No  . Sexual activity: Not on file  Lifestyle  . Physical activity:    Days per week: Not on file    Minutes per session: Not on file  . Stress: Not on file  Relationships  . Social connections:    Talks on phone: Not on file    Gets together: Not on file    Attends religious service: Not on file    Active member of club or organization: Not on file    Attends meetings of clubs or organizations: Not on file    Relationship status: Not on file  Other Topics Concern  . Not on file  Social History Narrative  . Not on file   Hospital Course:  Kendra [name] was admitted for Major depression, recurrent, chronic (HCC) and crisis management.  She was treated with the following medications Zoloft 150mg .  Kendra [name] was discharged with current medication and was instructed on how to take medications as prescribed; (details listed below under Medication List).  Medical problems were identified and treated as needed.  Home medications were restarted as appropriate. We also discussed her alcohol intake.  We discussed that the alcohol is a depressant.  She plans on cutting back on that.  Improvement was monitored by observation  and [facility] daily report of symptom reduction.  Emotional and mental status was monitored by daily self-inventory reports completed by [facility] and clinical staff.         Kendra [name] was evaluated by the treatment team for stability and plans for continued recovery upon discharge.  Kendra [name] motivation was an integral factor for scheduling further treatment.  Employment, transportation, bed availability, health status, family support, and any pending legal issues were also considered during her hospital stay.  She was offered further treatment options upon discharge including but not limited to Residential, Intensive Outpatient, and Outpatient treatment.  Kendra [name] will follow up with the services as listed below under Follow Up Information.     Upon completion of this admission the Kendra [name] was both mentally and medically stable for discharge denying suicidal/homicidal ideation, auditory/visual/tactile hallucinations, delusional thoughts and paranoia.     Physical Findings: AIMS: Facial and Oral Movements Muscles of Facial Expression: None, normal Lips and Perioral Area: None, normal Jaw: None, normal Tongue: None, normal,Extremity Movements Upper (arms, wrists, hands, fingers): None, normal Lower (legs, knees, ankles, toes): None, normal, Trunk Movements Neck, shoulders, hips: None, normal, Overall Severity Severity of abnormal movements (highest score from questions above): None, normal Incapacitation due to abnormal movements: None, normal Patient's awareness of abnormal movements (rate only patient's report): No Awareness, Dental Status Current problems with teeth and/or dentures?: No Does patient usually wear dentures?: No   Musculoskeletal: Strength & Muscle Tone: within normal limits Gait & Station: normal Patient leans: N/A  Psychiatric Specialty Exam:See MD SRA Physical Exam  Nursing note and vitals reviewed.   ROS  Blood pressure  104/76, pulse 87, temperature 98 F (36.7 C), temperature source Oral, resp. rate 18, height 5\' 5"  (1.651 m), weight 64.4 kg (142 lb), SpO2 95 %.Body mass index is 23.63 kg/m.  Sleep:  Number of Hours: 6.75   Have you used any form of tobacco in the last 30 days? (Cigarettes, Smokeless Tobacco, Cigars, and/or Pipes): No  Has this patient used any form of tobacco in the last 30 days? (Cigarettes, Smokeless Tobacco, Cigars, and/or Pipes) No  Blood Alcohol level:  Lab Results  Component Value Date   ETH 114 (H) 08/13/2017    Metabolic Disorder Labs:  Lab Results  Component Value Date   HGBA1C 6.4 (H) 08/15/2017   MPG 136.98 08/15/2017   No results found for: PROLACTIN Lab Results  Component Value Date   CHOL 179 08/15/2017   TRIG 153 (H) 08/15/2017   HDL 60 08/15/2017   CHOLHDL 3.0 08/15/2017   VLDL 31 08/15/2017   LDLCALC 88 08/15/2017    See Psychiatric Specialty Exam and Suicide Risk Assessment completed by Attending Physician prior to discharge.  Discharge destination:  Home  Is patient on multiple antipsychotic therapies at discharge:  No   Has Patient had three or more failed trials of antipsychotic monotherapy by history:  No  Recommended Plan for Multiple Antipsychotic Therapies: NA   Allergies as of 08/16/2017   No Known Allergies     Medication List    STOP taking these medications   ALLEGRA PO   clotrimazole-betamethasone cream Commonly known as:  LOTRISONE   HAIR/SKIN/NAILS/BIOTIN Tabs   PROAIR HFA 108 (90 Base) MCG/ACT inhaler Generic drug:  albuterol   SYSTANE OP   traMADol 50 MG tablet Commonly known as:  ULTRAM   VITAMIN D3 PO   zolpidem 10 MG tablet Commonly known as:  AMBIEN     TAKE these medications     Indication  cyclobenzaprine 10 MG tablet Commonly known as:  FLEXERIL Take 1 tablet (10 mg total) by mouth 2 (two) times daily as needed for muscle spasms.  Indication:  Muscle Spasm   hydrOXYzine 25 MG tablet Commonly known  as:  ATARAX/VISTARIL Take 1 tablet (25 mg total) by mouth every 6 (six) hours as needed for anxiety.  Indication:  Feeling Anxious   lisinopril 5 MG tablet Commonly known as:  PRINIVIL,ZESTRIL Take 5 mg by mouth at bedtime.  Indication:  High Blood Pressure Disorder   LOVAZA 1 g capsule Generic drug:  omega-3 acid ethyl esters Take 4 g by mouth at bedtime.  Indication:  High Amount of Triglycerides in the Blood   metFORMIN 1000 MG  tablet Commonly known as:  GLUCOPHAGE Take 1,000 mg by mouth 2 (two) times daily.  Indication:  Type 2 Diabetes   naproxen 500 MG tablet Commonly known as:  NAPROSYN Take 500 mg by mouth 4 (four) times daily as needed (pain).  Indication:  Pain   rosuvastatin 10 MG tablet Commonly known as:  CRESTOR Take 10 mg by mouth at bedtime.  Indication:  High Amount of Fats in the Blood   sertraline 50 MG tablet Commonly known as:  ZOLOFT Take 3 tablets (150 mg total) by mouth daily. Start taking on:  08/17/2017 What changed:    medication strength  how much to take  when to take this  Indication:  Generalized Anxiety Disorder, Major Depressive Disorder   traZODone 50 MG tablet Commonly known as:  DESYREL Take 1 tablet (50 mg total) by mouth at bedtime and may repeat dose one time if needed.  Indication:  Trouble Sleeping      Follow-up Information    Burdine, Ananias PilgrimSteven E, MD. Go on 08/20/2017.   Specialty:  Family Medicine Why:  Appointment for medication management is Tuesday, 08/20/17 at 10:00am with Dr. Leandrew KoyanagiBurdine.  Please be sure to bring any discharge paperwork from this hospitalization, including any medication lists.  Contact information: [address] [place] [address] [phone]        [place], [facility]. Call.   Why:  Please follow up with [facility] regarding grief couneling and therapy services. Patient must call and schedule an appointment.  Contact information: [address] [place]  [id number] [phone]           Follow-up recommendations:  Activity:  Increase activity as tolerated.  Diet:  Regular house diet Tests:  Routine testing as suggested by outpatient psychiatrist.  Other:  Even if you begin to feel better.    Signed: Truman Haywardakia S Starkes, FNP 08/16/2017, 11:40 AM

## 2017-08-16 NOTE — Progress Notes (Signed)
  [facility] Adult Case Management Discharge Plan :  Will you be returning to the same living situation after discharge:  Yes,  patient reports she is returning home with her husband At discharge, do you have transportation home?: Yes,  patient's husband will pick her up at discharge Do you have the ability to pay for your medications: Yes,  [employer], retirement pension, support from husband   Release of information consent forms completed and in the chart;  Patient's signature needed at discharge.  Patient to Follow up at: Follow-up Information    Burdine, Ananias PilgrimSteven E, MD. Go on 08/20/2017.   Specialty:  Family Medicine Why:  Appointment for medication management is Tuesday, 08/20/17 at 10:00am with Dr. Leandrew KoyanagiBurdine.  Please be sure to bring any discharge paperwork from this hospitalization, including any medication lists.  Contact information: [address] [place] [address] [phone]        [place], [facility]. Call.   Why:  Please follow up with [facility] regarding grief couneling and therapy services. Patient must call and schedule an appointment.  Contact information: [address] [place] [address] [phone]           Next level of care provider has access to [facility] Link:yes  Safety Planning and Suicide Prevention discussed: Yes,  with the patient's husband  Have you used any form of tobacco in the last 30 days? (Cigarettes, Smokeless Tobacco, Cigars, and/or Pipes): No  Has patient been referred to the Quitline?: N/A patient is not a smoker  Patient has been referred for addiction treatment: N/A  Maeola SarahJolan E Zorina Mallin, LCSWA 08/16/2017, 9:58 AM

## 2017-08-16 NOTE — Tx Team (Signed)
Interdisciplinary Treatment and Diagnostic Plan Update  08/16/2017 Time of Session: 9:30am Kendra [name] MRN: [id number]  Principal Diagnosis: <principal problem not specified>  Secondary Diagnoses: Active Problems:   Major depression, recurrent, chronic (HCC)   Current Medications:  Current Facility-Administered Medications  Medication Dose Route Frequency Provider Last Rate Last Dose  . acetaminophen (TYLENOL) tablet 650 mg  650 mg Oral Q6H PRN Sharma Covert, MD      . alum & mag hydroxide-simeth (MAALOX/MYLANTA) 200-200-20 MG/5ML suspension 30 mL  30 mL Oral Q4H PRN Sharma Covert, MD      . cyclobenzaprine (FLEXERIL) tablet 5 mg  5 mg Oral TID PRN Sharma Covert, MD      . folic acid (FOLVITE) tablet 1 mg  1 mg Oral Daily Sharma Covert, MD   1 mg at 08/16/17 0754  . hydrOXYzine (ATARAX/VISTARIL) tablet 25 mg  25 mg Oral Q6H PRN Sharma Covert, MD   25 mg at 08/15/17 2205  . lisinopril (PRINIVIL,ZESTRIL) tablet 5 mg  5 mg Oral Daily Sharma Covert, MD   5 mg at 08/16/17 0754  . LORazepam (ATIVAN) tablet 0.5 mg  0.5 mg Oral Q6H PRN Sharma Covert, MD      . magnesium hydroxide (MILK OF MAGNESIA) suspension 30 mL  30 mL Oral Daily PRN Sharma Covert, MD      . metFORMIN (GLUCOPHAGE) tablet 1,000 mg  1,000 mg Oral BID WC Sharma Covert, MD   1,000 mg at 08/16/17 0754  . naproxen (NAPROSYN) tablet 500 mg  500 mg Oral BID PRN Sharma Covert, MD      . omega-3 acid ethyl esters (LOVAZA) capsule 1 g  1 g Oral QID Sharma Covert, MD   1 g at 08/16/17 0754  . rosuvastatin (CRESTOR) tablet 10 mg  10 mg Oral q1800 Sharma Covert, MD   10 mg at 08/15/17 1712  . sertraline (ZOLOFT) tablet 150 mg  150 mg Oral Daily Sharma Covert, MD   150 mg at 08/16/17 0754  . thiamine (VITAMIN B-1) tablet 100 mg  100 mg Oral Daily Sharma Covert, MD   100 mg at 08/16/17 0754  . traMADol (ULTRAM) tablet 50 mg  50 mg Oral Q6H PRN Sharma Covert, MD       . traZODone (DESYREL) tablet 50 mg  50 mg Oral QHS,MR X 1 Withrow, John C, FNP   50 mg at 08/15/17 2205  . zolpidem (AMBIEN) tablet 5 mg  5 mg Oral QHS PRN Sharma Covert, MD       PTA Medications: Medications Prior to Admission  Medication Sig Dispense Refill Last Dose  . albuterol (PROAIR HFA) 108 (90 Base) MCG/ACT inhaler Inhale 1-2 puffs into the lungs every 4 (four) hours as needed for wheezing or shortness of breath.   year ago  . Cholecalciferol (VITAMIN D3 PO) Take 1 tablet by mouth at bedtime.   04/06/2017 at pm  . clotrimazole-betamethasone (LOTRISONE) cream Apply 1 application topically daily as needed (psoriasis/eczema).   month ago  . cyclobenzaprine (FLEXERIL) 10 MG tablet Take 1 tablet (10 mg total) by mouth 2 (two) times daily as needed for muscle spasms. 20 tablet 0   . Fexofenadine HCl (ALLEGRA PO) Take 1 tablet by mouth daily.   04/06/2017 at Unknown time  . lisinopril (PRINIVIL,ZESTRIL) 5 MG tablet Take 5 mg by mouth at bedtime.    04/06/2017 at pm  . metFORMIN (GLUCOPHAGE) 1000  MG tablet Take 1,000 mg by mouth 2 (two) times daily.  3 04/07/2017 at am  . Multiple Vitamins-Minerals (HAIR/SKIN/NAILS/BIOTIN) TABS Take 1 tablet by mouth at bedtime.   04/06/2017 at pm  . naproxen (NAPROSYN) 500 MG tablet Take 500 mg by mouth 4 (four) times daily as needed (pain).    04/07/2017 at am  . omega-3 acid ethyl esters (LOVAZA) 1 G capsule Take 4 g by mouth at bedtime.    04/06/2017 at pm  . Polyethyl Glycol-Propyl Glycol (SYSTANE OP) Place 1 drop into both eyes daily as needed (dry eyes).   few days ago  . rosuvastatin (CRESTOR) 10 MG tablet Take 10 mg by mouth at bedtime.    04/06/2017 at pm  . sertraline (ZOLOFT) 100 MG tablet Take 50 mg by mouth at bedtime.    04/06/2017 at pm  . traMADol (ULTRAM) 50 MG tablet Take 50 mg by mouth every 6 (six) hours as needed (pain).   04/07/2017 at am  . zolpidem (AMBIEN) 10 MG tablet Take 10 mg by mouth at bedtime.    04/06/2017 at pm    Patient  Stressors: Health problems  Patient Strengths: Ability for insight Average or above average intelligence Communication skills Supportive family/friends  Treatment Modalities: Medication Management, Group therapy, Case management,  1 to 1 session with clinician, Psychoeducation, Recreational therapy.   Physician Treatment Plan for Primary Diagnosis: <principal problem not specified> Long Term Goal(s): Improvement in symptoms so as ready for discharge Improvement in symptoms so as ready for discharge   Short Term Goals: Ability to identify changes in lifestyle to reduce recurrence of condition will improve Ability to verbalize feelings will improve Ability to disclose and discuss suicidal ideas Ability to demonstrate self-control will improve Ability to identify and develop effective coping behaviors will improve Ability to maintain clinical measurements within normal limits will improve Ability to identify changes in lifestyle to reduce recurrence of condition will improve Ability to verbalize feelings will improve Ability to disclose and discuss suicidal ideas Ability to demonstrate self-control will improve Ability to identify and develop effective coping behaviors will improve Ability to maintain clinical measurements within normal limits will improve  Medication Management: Evaluate patient's response, side effects, and tolerance of medication regimen.  Therapeutic Interventions: 1 to 1 sessions, Unit Group sessions and Medication administration.  Evaluation of Outcomes: Not Met  Physician Treatment Plan for Secondary Diagnosis: Active Problems:   Major depression, recurrent, chronic (HCC)  Long Term Goal(s): Improvement in symptoms so as ready for discharge Improvement in symptoms so as ready for discharge   Short Term Goals: Ability to identify changes in lifestyle to reduce recurrence of condition will improve Ability to verbalize feelings will improve Ability to  disclose and discuss suicidal ideas Ability to demonstrate self-control will improve Ability to identify and develop effective coping behaviors will improve Ability to maintain clinical measurements within normal limits will improve Ability to identify changes in lifestyle to reduce recurrence of condition will improve Ability to verbalize feelings will improve Ability to disclose and discuss suicidal ideas Ability to demonstrate self-control will improve Ability to identify and develop effective coping behaviors will improve Ability to maintain clinical measurements within normal limits will improve     Medication Management: Evaluate patient's response, side effects, and tolerance of medication regimen.  Therapeutic Interventions: 1 to 1 sessions, Unit Group sessions and Medication administration.  Evaluation of Outcomes: Not Met   RN Treatment Plan for Primary Diagnosis: <principal problem not specified> Long Term Goal(s): Knowledge  of disease and therapeutic regimen to maintain health will improve  Short Term Goals: Ability to disclose and discuss suicidal ideas, Ability to identify and develop effective coping behaviors will improve and Compliance with prescribed medications will improve  Medication Management: RN will administer medications as ordered by provider, will assess and evaluate patient's response and provide education to patient for prescribed medication. RN will report any adverse and/or side effects to prescribing provider.  Therapeutic Interventions: 1 on 1 counseling sessions, Psychoeducation, Medication administration, Evaluate responses to treatment, Monitor vital signs and CBGs as ordered, Perform/monitor CIWA, COWS, AIMS and Fall Risk screenings as ordered, Perform wound care treatments as ordered.  Evaluation of Outcomes: Not Met   LCSW Treatment Plan for Primary Diagnosis: <principal problem not specified> Long Term Goal(s): Safe transition to appropriate  next level of care at discharge, Engage patient in therapeutic group addressing interpersonal concerns.  Short Term Goals: Engage patient in aftercare planning with referrals and resources and Increase skills for wellness and recovery  Therapeutic Interventions: Assess for all discharge needs, 1 to 1 time with Social worker, Explore available resources and support systems, Assess for adequacy in community support network, Educate family and significant other(s) on suicide prevention, Complete Psychosocial Assessment, Interpersonal group therapy.  Evaluation of Outcomes: Not Met   Progress in Treatment: Attending groups: No. Participating in groups: No. Taking medication as prescribed: Yes. Toleration medication: Yes.   Family/Significant other contact made: No, will contact:  with patient's husband Patient understands diagnosis: Yes. Discussing patient identified problems/goals with staff: Yes. Medical problems stabilized or resolved: Yes. Denies suicidal/homicidal ideation: Yes. Issues/concerns per patient self-inventory: No. Other:   New problem(s) identified: None   New Short Term/Long Term Goal(s):medication stabilization, elimination of SI thoughts, development of comprehensive mental wellness plan.   Patient Goals:  "I need coping skills for dealing with loss"  Discharge Plan or Barriers: CSW will assess for appropriate referrals and discharge plans.   Reason for Continuation of Hospitalization: Depression Medication stabilization Suicidal ideation  Estimated Length of Stay:3-5 days   Attendees: Patient: Kendra [name]  08/16/2017 8:41 AM  Physician: Dr. Myles Lipps, MD 08/16/2017 8:41 AM  Nursing: Lurline Hare 08/16/2017 8:41 AM  RN Care Manager:X 08/16/2017 8:41 AM  Social Worker: Radonna Ricker, [place] 08/16/2017 8:41 AM  Recreational Therapist: Rhunette Croft 08/16/2017 8:41 AM  Other: X 08/16/2017 8:41 AM  Other: X 08/16/2017 8:41 AM  Other:X 08/16/2017 8:41 AM    Scribe for Treatment  Team: Marylee Floras, [place] 08/16/2017 8:41 AM

## 2017-08-16 NOTE — Progress Notes (Signed)
Recreation Therapy Notes  Date: 7.5.19 Time: 0930 Location: [address] Dayroom  Group Topic: Stress Management  Goal Area(s) Addresses:  Patient will verbalize importance of using healthy stress management.  Patient will identify positive emotions associated with healthy stress management.   Intervention: Stress Management  Activity :  Meditation.  LRT introduced the stress management technique of meditation.  LRT played a meditation on resilience that allowed patients to focus on being able to withstand obstacles that arise.  Patients were to listen and follow along as meditation played to engage in the activity.  Education:  Stress Management, Discharge Planning.   Education Outcome: Acknowledges edcuation/In group clarification offered/Needs additional education  Clinical Observations/Feedback: Pt did not attend group.     Caroll RancherMarjette Kobi Aller, LRT/CTRS         Lillia AbedLindsay, Kaiyana Bedore A 08/16/2017 11:04 AM

## 2017-08-20 DIAGNOSIS — F3289 Other specified depressive episodes: Secondary | ICD-10-CM | POA: Diagnosis not present

## 2017-08-20 DIAGNOSIS — F339 Major depressive disorder, recurrent, unspecified: Secondary | ICD-10-CM | POA: Diagnosis not present

## 2017-09-02 DIAGNOSIS — Z13820 Encounter for screening for osteoporosis: Secondary | ICD-10-CM | POA: Diagnosis not present

## 2017-09-15 DIAGNOSIS — F3289 Other specified depressive episodes: Secondary | ICD-10-CM | POA: Diagnosis not present

## 2017-09-15 DIAGNOSIS — R5383 Other fatigue: Secondary | ICD-10-CM | POA: Diagnosis not present

## 2017-09-15 DIAGNOSIS — F339 Major depressive disorder, recurrent, unspecified: Secondary | ICD-10-CM | POA: Diagnosis not present

## 2017-09-15 DIAGNOSIS — G4709 Other insomnia: Secondary | ICD-10-CM | POA: Diagnosis not present

## 2017-10-21 DIAGNOSIS — Z1331 Encounter for screening for depression: Secondary | ICD-10-CM | POA: Diagnosis not present

## 2017-10-21 DIAGNOSIS — G4709 Other insomnia: Secondary | ICD-10-CM | POA: Diagnosis not present

## 2017-10-21 DIAGNOSIS — F339 Major depressive disorder, recurrent, unspecified: Secondary | ICD-10-CM | POA: Diagnosis not present

## 2017-10-21 DIAGNOSIS — Z1389 Encounter for screening for other disorder: Secondary | ICD-10-CM | POA: Diagnosis not present

## 2017-10-21 DIAGNOSIS — E119 Type 2 diabetes mellitus without complications: Secondary | ICD-10-CM | POA: Diagnosis not present

## 2017-10-21 DIAGNOSIS — F411 Generalized anxiety disorder: Secondary | ICD-10-CM | POA: Diagnosis not present

## 2017-12-18 DIAGNOSIS — L820 Inflamed seborrheic keratosis: Secondary | ICD-10-CM | POA: Diagnosis not present

## 2017-12-30 DIAGNOSIS — E783 Hyperchylomicronemia: Secondary | ICD-10-CM | POA: Diagnosis not present

## 2017-12-30 DIAGNOSIS — E782 Mixed hyperlipidemia: Secondary | ICD-10-CM | POA: Diagnosis not present

## 2017-12-30 DIAGNOSIS — E119 Type 2 diabetes mellitus without complications: Secondary | ICD-10-CM | POA: Diagnosis not present

## 2017-12-31 DIAGNOSIS — M5126 Other intervertebral disc displacement, lumbar region: Secondary | ICD-10-CM | POA: Diagnosis not present

## 2017-12-31 DIAGNOSIS — R030 Elevated blood-pressure reading, without diagnosis of hypertension: Secondary | ICD-10-CM | POA: Diagnosis not present

## 2018-01-02 DIAGNOSIS — E782 Mixed hyperlipidemia: Secondary | ICD-10-CM | POA: Diagnosis not present

## 2018-01-02 DIAGNOSIS — Z23 Encounter for immunization: Secondary | ICD-10-CM | POA: Diagnosis not present

## 2018-01-02 DIAGNOSIS — Z0001 Encounter for general adult medical examination with abnormal findings: Secondary | ICD-10-CM | POA: Diagnosis not present

## 2018-01-02 DIAGNOSIS — E119 Type 2 diabetes mellitus without complications: Secondary | ICD-10-CM | POA: Diagnosis not present

## 2018-02-12 HISTORY — PX: CERVICAL SPINE SURGERY: SHX589

## 2018-04-17 DIAGNOSIS — M4802 Spinal stenosis, cervical region: Secondary | ICD-10-CM | POA: Diagnosis not present

## 2018-04-17 DIAGNOSIS — M50221 Other cervical disc displacement at C4-C5 level: Secondary | ICD-10-CM | POA: Diagnosis not present

## 2018-04-17 DIAGNOSIS — M47812 Spondylosis without myelopathy or radiculopathy, cervical region: Secondary | ICD-10-CM | POA: Diagnosis not present

## 2018-04-24 DIAGNOSIS — R29898 Other symptoms and signs involving the musculoskeletal system: Secondary | ICD-10-CM | POA: Diagnosis not present

## 2018-04-24 DIAGNOSIS — M4722 Other spondylosis with radiculopathy, cervical region: Secondary | ICD-10-CM | POA: Diagnosis not present

## 2018-04-24 DIAGNOSIS — M542 Cervicalgia: Secondary | ICD-10-CM | POA: Diagnosis not present

## 2018-05-20 DIAGNOSIS — R29898 Other symptoms and signs involving the musculoskeletal system: Secondary | ICD-10-CM | POA: Diagnosis not present

## 2018-05-20 DIAGNOSIS — G5603 Carpal tunnel syndrome, bilateral upper limbs: Secondary | ICD-10-CM | POA: Diagnosis not present

## 2018-05-20 DIAGNOSIS — M5412 Radiculopathy, cervical region: Secondary | ICD-10-CM | POA: Diagnosis not present

## 2018-05-27 DIAGNOSIS — M4802 Spinal stenosis, cervical region: Secondary | ICD-10-CM | POA: Diagnosis not present

## 2018-05-27 DIAGNOSIS — M4712 Other spondylosis with myelopathy, cervical region: Secondary | ICD-10-CM | POA: Diagnosis not present

## 2018-06-27 DIAGNOSIS — E119 Type 2 diabetes mellitus without complications: Secondary | ICD-10-CM | POA: Diagnosis not present

## 2018-06-27 DIAGNOSIS — E875 Hyperkalemia: Secondary | ICD-10-CM | POA: Diagnosis not present

## 2018-06-27 DIAGNOSIS — E783 Hyperchylomicronemia: Secondary | ICD-10-CM | POA: Diagnosis not present

## 2018-06-27 DIAGNOSIS — E782 Mixed hyperlipidemia: Secondary | ICD-10-CM | POA: Diagnosis not present

## 2018-06-27 DIAGNOSIS — R5383 Other fatigue: Secondary | ICD-10-CM | POA: Diagnosis not present

## 2018-07-09 DIAGNOSIS — M4712 Other spondylosis with myelopathy, cervical region: Secondary | ICD-10-CM | POA: Diagnosis not present

## 2018-08-05 DIAGNOSIS — M4802 Spinal stenosis, cervical region: Secondary | ICD-10-CM | POA: Diagnosis not present

## 2018-09-11 DIAGNOSIS — H5213 Myopia, bilateral: Secondary | ICD-10-CM | POA: Diagnosis not present

## 2018-09-11 DIAGNOSIS — E119 Type 2 diabetes mellitus without complications: Secondary | ICD-10-CM | POA: Diagnosis not present

## 2018-09-11 DIAGNOSIS — H04123 Dry eye syndrome of bilateral lacrimal glands: Secondary | ICD-10-CM | POA: Diagnosis not present

## 2018-09-11 DIAGNOSIS — H43813 Vitreous degeneration, bilateral: Secondary | ICD-10-CM | POA: Diagnosis not present

## 2018-10-27 DIAGNOSIS — Z1231 Encounter for screening mammogram for malignant neoplasm of breast: Secondary | ICD-10-CM | POA: Diagnosis not present

## 2018-10-27 DIAGNOSIS — Z6822 Body mass index (BMI) 22.0-22.9, adult: Secondary | ICD-10-CM | POA: Diagnosis not present

## 2018-10-27 DIAGNOSIS — L292 Pruritus vulvae: Secondary | ICD-10-CM | POA: Diagnosis not present

## 2018-10-27 DIAGNOSIS — Z124 Encounter for screening for malignant neoplasm of cervix: Secondary | ICD-10-CM | POA: Diagnosis not present

## 2018-12-26 DIAGNOSIS — E875 Hyperkalemia: Secondary | ICD-10-CM | POA: Diagnosis not present

## 2018-12-26 DIAGNOSIS — E7800 Pure hypercholesterolemia, unspecified: Secondary | ICD-10-CM | POA: Diagnosis not present

## 2018-12-26 DIAGNOSIS — E783 Hyperchylomicronemia: Secondary | ICD-10-CM | POA: Diagnosis not present

## 2018-12-26 DIAGNOSIS — R5383 Other fatigue: Secondary | ICD-10-CM | POA: Diagnosis not present

## 2018-12-26 DIAGNOSIS — E1169 Type 2 diabetes mellitus with other specified complication: Secondary | ICD-10-CM | POA: Diagnosis not present

## 2018-12-26 DIAGNOSIS — E782 Mixed hyperlipidemia: Secondary | ICD-10-CM | POA: Diagnosis not present

## 2019-03-18 DIAGNOSIS — Z23 Encounter for immunization: Secondary | ICD-10-CM | POA: Diagnosis not present

## 2019-04-16 DIAGNOSIS — Z23 Encounter for immunization: Secondary | ICD-10-CM | POA: Diagnosis not present

## 2019-06-11 DIAGNOSIS — E1169 Type 2 diabetes mellitus with other specified complication: Secondary | ICD-10-CM | POA: Diagnosis not present

## 2019-06-11 DIAGNOSIS — E782 Mixed hyperlipidemia: Secondary | ICD-10-CM | POA: Diagnosis not present

## 2019-06-11 DIAGNOSIS — E559 Vitamin D deficiency, unspecified: Secondary | ICD-10-CM | POA: Diagnosis not present

## 2019-06-11 DIAGNOSIS — Z0001 Encounter for general adult medical examination with abnormal findings: Secondary | ICD-10-CM | POA: Diagnosis not present

## 2019-06-11 DIAGNOSIS — R5383 Other fatigue: Secondary | ICD-10-CM | POA: Diagnosis not present

## 2019-06-17 DIAGNOSIS — E782 Mixed hyperlipidemia: Secondary | ICD-10-CM | POA: Diagnosis not present

## 2019-06-17 DIAGNOSIS — E1169 Type 2 diabetes mellitus with other specified complication: Secondary | ICD-10-CM | POA: Diagnosis not present

## 2019-06-17 DIAGNOSIS — M1811 Unilateral primary osteoarthritis of first carpometacarpal joint, right hand: Secondary | ICD-10-CM | POA: Diagnosis not present

## 2019-06-17 DIAGNOSIS — F339 Major depressive disorder, recurrent, unspecified: Secondary | ICD-10-CM | POA: Diagnosis not present

## 2019-06-17 DIAGNOSIS — M5116 Intervertebral disc disorders with radiculopathy, lumbar region: Secondary | ICD-10-CM | POA: Diagnosis not present

## 2019-06-17 DIAGNOSIS — Z0001 Encounter for general adult medical examination with abnormal findings: Secondary | ICD-10-CM | POA: Diagnosis not present

## 2019-06-17 DIAGNOSIS — G4709 Other insomnia: Secondary | ICD-10-CM | POA: Diagnosis not present

## 2019-06-17 DIAGNOSIS — Z6822 Body mass index (BMI) 22.0-22.9, adult: Secondary | ICD-10-CM | POA: Diagnosis not present

## 2019-09-01 DIAGNOSIS — E039 Hypothyroidism, unspecified: Secondary | ICD-10-CM | POA: Diagnosis not present

## 2019-09-01 DIAGNOSIS — E1169 Type 2 diabetes mellitus with other specified complication: Secondary | ICD-10-CM | POA: Diagnosis not present

## 2019-09-01 DIAGNOSIS — E875 Hyperkalemia: Secondary | ICD-10-CM | POA: Diagnosis not present

## 2019-09-01 DIAGNOSIS — R5383 Other fatigue: Secondary | ICD-10-CM | POA: Diagnosis not present

## 2019-09-01 DIAGNOSIS — R7989 Other specified abnormal findings of blood chemistry: Secondary | ICD-10-CM | POA: Diagnosis not present

## 2019-10-08 DIAGNOSIS — H43813 Vitreous degeneration, bilateral: Secondary | ICD-10-CM | POA: Diagnosis not present

## 2019-10-08 DIAGNOSIS — H524 Presbyopia: Secondary | ICD-10-CM | POA: Diagnosis not present

## 2019-10-08 DIAGNOSIS — E119 Type 2 diabetes mellitus without complications: Secondary | ICD-10-CM | POA: Diagnosis not present

## 2019-10-08 DIAGNOSIS — H04123 Dry eye syndrome of bilateral lacrimal glands: Secondary | ICD-10-CM | POA: Diagnosis not present

## 2019-12-02 DIAGNOSIS — E039 Hypothyroidism, unspecified: Secondary | ICD-10-CM | POA: Diagnosis not present

## 2019-12-28 DIAGNOSIS — Z23 Encounter for immunization: Secondary | ICD-10-CM | POA: Diagnosis not present

## 2020-03-02 DIAGNOSIS — E7800 Pure hypercholesterolemia, unspecified: Secondary | ICD-10-CM | POA: Diagnosis not present

## 2020-03-02 DIAGNOSIS — E1169 Type 2 diabetes mellitus with other specified complication: Secondary | ICD-10-CM | POA: Diagnosis not present

## 2020-03-02 DIAGNOSIS — R7989 Other specified abnormal findings of blood chemistry: Secondary | ICD-10-CM | POA: Diagnosis not present

## 2020-03-02 DIAGNOSIS — E039 Hypothyroidism, unspecified: Secondary | ICD-10-CM | POA: Diagnosis not present

## 2020-03-02 DIAGNOSIS — E783 Hyperchylomicronemia: Secondary | ICD-10-CM | POA: Diagnosis not present

## 2020-03-02 DIAGNOSIS — E782 Mixed hyperlipidemia: Secondary | ICD-10-CM | POA: Diagnosis not present

## 2020-03-07 DIAGNOSIS — E039 Hypothyroidism, unspecified: Secondary | ICD-10-CM | POA: Diagnosis not present

## 2020-03-07 DIAGNOSIS — R7989 Other specified abnormal findings of blood chemistry: Secondary | ICD-10-CM | POA: Diagnosis not present

## 2020-03-07 DIAGNOSIS — E7849 Other hyperlipidemia: Secondary | ICD-10-CM | POA: Diagnosis not present

## 2020-03-07 DIAGNOSIS — Z23 Encounter for immunization: Secondary | ICD-10-CM | POA: Diagnosis not present

## 2020-03-07 DIAGNOSIS — G4440 Drug-induced headache, not elsewhere classified, not intractable: Secondary | ICD-10-CM | POA: Diagnosis not present

## 2020-03-07 DIAGNOSIS — F339 Major depressive disorder, recurrent, unspecified: Secondary | ICD-10-CM | POA: Diagnosis not present

## 2020-03-07 DIAGNOSIS — E1169 Type 2 diabetes mellitus with other specified complication: Secondary | ICD-10-CM | POA: Diagnosis not present

## 2020-03-22 DIAGNOSIS — N958 Other specified menopausal and perimenopausal disorders: Secondary | ICD-10-CM | POA: Diagnosis not present

## 2020-03-22 DIAGNOSIS — Z7689 Persons encountering health services in other specified circumstances: Secondary | ICD-10-CM | POA: Diagnosis not present

## 2020-03-22 DIAGNOSIS — Z01419 Encounter for gynecological examination (general) (routine) without abnormal findings: Secondary | ICD-10-CM | POA: Diagnosis not present

## 2020-03-22 DIAGNOSIS — Z6824 Body mass index (BMI) 24.0-24.9, adult: Secondary | ICD-10-CM | POA: Diagnosis not present

## 2020-03-22 DIAGNOSIS — Z124 Encounter for screening for malignant neoplasm of cervix: Secondary | ICD-10-CM | POA: Diagnosis not present

## 2020-03-22 DIAGNOSIS — Z1231 Encounter for screening mammogram for malignant neoplasm of breast: Secondary | ICD-10-CM | POA: Diagnosis not present

## 2020-03-28 ENCOUNTER — Other Ambulatory Visit: Payer: Self-pay | Admitting: Obstetrics and Gynecology

## 2020-03-28 DIAGNOSIS — R928 Other abnormal and inconclusive findings on diagnostic imaging of breast: Secondary | ICD-10-CM

## 2020-03-31 DIAGNOSIS — J029 Acute pharyngitis, unspecified: Secondary | ICD-10-CM | POA: Diagnosis not present

## 2020-04-12 ENCOUNTER — Ambulatory Visit: Payer: BLUE CROSS/BLUE SHIELD

## 2020-04-12 ENCOUNTER — Ambulatory Visit
Admission: RE | Admit: 2020-04-12 | Discharge: 2020-04-12 | Disposition: A | Discharge status: Home / Self Care | Payer: Medicare Other | Source: Ambulatory Visit | Attending: Obstetrics and Gynecology | Admitting: Obstetrics and Gynecology

## 2020-04-12 ENCOUNTER — Other Ambulatory Visit: Payer: Self-pay

## 2020-04-12 DIAGNOSIS — R928 Other abnormal and inconclusive findings on diagnostic imaging of breast: Secondary | ICD-10-CM

## 2020-04-12 DIAGNOSIS — R922 Inconclusive mammogram: Secondary | ICD-10-CM | POA: Diagnosis not present

## 2020-06-01 DIAGNOSIS — E782 Mixed hyperlipidemia: Secondary | ICD-10-CM | POA: Diagnosis not present

## 2020-06-01 DIAGNOSIS — E7849 Other hyperlipidemia: Secondary | ICD-10-CM | POA: Diagnosis not present

## 2020-06-01 DIAGNOSIS — E1169 Type 2 diabetes mellitus with other specified complication: Secondary | ICD-10-CM | POA: Diagnosis not present

## 2020-06-01 DIAGNOSIS — R7989 Other specified abnormal findings of blood chemistry: Secondary | ICD-10-CM | POA: Diagnosis not present

## 2020-06-07 DIAGNOSIS — R7989 Other specified abnormal findings of blood chemistry: Secondary | ICD-10-CM | POA: Diagnosis not present

## 2020-06-07 DIAGNOSIS — E1169 Type 2 diabetes mellitus with other specified complication: Secondary | ICD-10-CM | POA: Diagnosis not present

## 2020-06-07 DIAGNOSIS — M7581 Other shoulder lesions, right shoulder: Secondary | ICD-10-CM | POA: Diagnosis not present

## 2020-06-07 DIAGNOSIS — F339 Major depressive disorder, recurrent, unspecified: Secondary | ICD-10-CM | POA: Diagnosis not present

## 2020-06-07 DIAGNOSIS — E7849 Other hyperlipidemia: Secondary | ICD-10-CM | POA: Diagnosis not present

## 2020-06-07 DIAGNOSIS — E039 Hypothyroidism, unspecified: Secondary | ICD-10-CM | POA: Diagnosis not present

## 2020-07-06 DIAGNOSIS — M1811 Unilateral primary osteoarthritis of first carpometacarpal joint, right hand: Secondary | ICD-10-CM | POA: Diagnosis not present

## 2020-07-06 DIAGNOSIS — M7581 Other shoulder lesions, right shoulder: Secondary | ICD-10-CM | POA: Diagnosis not present

## 2020-07-06 DIAGNOSIS — Z6823 Body mass index (BMI) 23.0-23.9, adult: Secondary | ICD-10-CM | POA: Diagnosis not present

## 2020-08-30 DIAGNOSIS — Z23 Encounter for immunization: Secondary | ICD-10-CM | POA: Diagnosis not present

## 2020-09-06 DIAGNOSIS — F4321 Adjustment disorder with depressed mood: Secondary | ICD-10-CM | POA: Diagnosis not present

## 2020-09-06 DIAGNOSIS — F17210 Nicotine dependence, cigarettes, uncomplicated: Secondary | ICD-10-CM | POA: Diagnosis not present

## 2020-09-06 DIAGNOSIS — F339 Major depressive disorder, recurrent, unspecified: Secondary | ICD-10-CM | POA: Diagnosis not present

## 2020-09-06 DIAGNOSIS — Z0001 Encounter for general adult medical examination with abnormal findings: Secondary | ICD-10-CM | POA: Diagnosis not present

## 2020-09-06 DIAGNOSIS — E1169 Type 2 diabetes mellitus with other specified complication: Secondary | ICD-10-CM | POA: Diagnosis not present

## 2020-09-06 DIAGNOSIS — M1811 Unilateral primary osteoarthritis of first carpometacarpal joint, right hand: Secondary | ICD-10-CM | POA: Diagnosis not present

## 2020-09-06 DIAGNOSIS — E7849 Other hyperlipidemia: Secondary | ICD-10-CM | POA: Diagnosis not present

## 2020-09-06 DIAGNOSIS — F419 Anxiety disorder, unspecified: Secondary | ICD-10-CM | POA: Diagnosis not present

## 2020-11-01 DIAGNOSIS — Z20828 Contact with and (suspected) exposure to other viral communicable diseases: Secondary | ICD-10-CM | POA: Diagnosis not present

## 2020-11-01 DIAGNOSIS — Z23 Encounter for immunization: Secondary | ICD-10-CM | POA: Diagnosis not present

## 2020-11-24 DIAGNOSIS — H5213 Myopia, bilateral: Secondary | ICD-10-CM | POA: Diagnosis not present

## 2020-11-24 DIAGNOSIS — H04123 Dry eye syndrome of bilateral lacrimal glands: Secondary | ICD-10-CM | POA: Diagnosis not present

## 2020-11-24 DIAGNOSIS — H43813 Vitreous degeneration, bilateral: Secondary | ICD-10-CM | POA: Diagnosis not present

## 2020-11-24 DIAGNOSIS — E119 Type 2 diabetes mellitus without complications: Secondary | ICD-10-CM | POA: Diagnosis not present

## 2020-12-06 DIAGNOSIS — E039 Hypothyroidism, unspecified: Secondary | ICD-10-CM | POA: Diagnosis not present

## 2020-12-06 DIAGNOSIS — E7849 Other hyperlipidemia: Secondary | ICD-10-CM | POA: Diagnosis not present

## 2020-12-06 DIAGNOSIS — E782 Mixed hyperlipidemia: Secondary | ICD-10-CM | POA: Diagnosis not present

## 2020-12-06 DIAGNOSIS — E1169 Type 2 diabetes mellitus with other specified complication: Secondary | ICD-10-CM | POA: Diagnosis not present

## 2020-12-06 DIAGNOSIS — E7800 Pure hypercholesterolemia, unspecified: Secondary | ICD-10-CM | POA: Diagnosis not present

## 2020-12-06 DIAGNOSIS — R7989 Other specified abnormal findings of blood chemistry: Secondary | ICD-10-CM | POA: Diagnosis not present

## 2020-12-06 DIAGNOSIS — E559 Vitamin D deficiency, unspecified: Secondary | ICD-10-CM | POA: Diagnosis not present

## 2020-12-08 DIAGNOSIS — Z0001 Encounter for general adult medical examination with abnormal findings: Secondary | ICD-10-CM | POA: Diagnosis not present

## 2020-12-08 DIAGNOSIS — Z23 Encounter for immunization: Secondary | ICD-10-CM | POA: Diagnosis not present

## 2020-12-08 DIAGNOSIS — E7849 Other hyperlipidemia: Secondary | ICD-10-CM | POA: Diagnosis not present

## 2020-12-08 DIAGNOSIS — F339 Major depressive disorder, recurrent, unspecified: Secondary | ICD-10-CM | POA: Diagnosis not present

## 2020-12-08 DIAGNOSIS — E1169 Type 2 diabetes mellitus with other specified complication: Secondary | ICD-10-CM | POA: Diagnosis not present

## 2020-12-08 DIAGNOSIS — E039 Hypothyroidism, unspecified: Secondary | ICD-10-CM | POA: Diagnosis not present

## 2020-12-08 DIAGNOSIS — R7989 Other specified abnormal findings of blood chemistry: Secondary | ICD-10-CM | POA: Diagnosis not present

## 2021-03-08 DIAGNOSIS — E039 Hypothyroidism, unspecified: Secondary | ICD-10-CM | POA: Diagnosis not present

## 2021-03-23 DIAGNOSIS — R87620 Atypical squamous cells of undetermined significance on cytologic smear of vagina (ASC-US): Secondary | ICD-10-CM | POA: Diagnosis not present

## 2021-03-23 DIAGNOSIS — Z124 Encounter for screening for malignant neoplasm of cervix: Secondary | ICD-10-CM | POA: Diagnosis not present

## 2021-03-23 DIAGNOSIS — Z6823 Body mass index (BMI) 23.0-23.9, adult: Secondary | ICD-10-CM | POA: Diagnosis not present

## 2021-03-23 DIAGNOSIS — Z779 Other contact with and (suspected) exposures hazardous to health: Secondary | ICD-10-CM | POA: Diagnosis not present

## 2021-03-23 DIAGNOSIS — Z1231 Encounter for screening mammogram for malignant neoplasm of breast: Secondary | ICD-10-CM | POA: Diagnosis not present

## 2021-03-23 DIAGNOSIS — R87610 Atypical squamous cells of undetermined significance on cytologic smear of cervix (ASC-US): Secondary | ICD-10-CM | POA: Diagnosis not present

## 2021-03-24 ENCOUNTER — Other Ambulatory Visit: Payer: Self-pay | Admitting: Obstetrics and Gynecology

## 2021-03-24 DIAGNOSIS — Z8249 Family history of ischemic heart disease and other diseases of the circulatory system: Secondary | ICD-10-CM

## 2021-04-27 DIAGNOSIS — R1013 Epigastric pain: Secondary | ICD-10-CM | POA: Diagnosis not present

## 2021-04-27 DIAGNOSIS — K219 Gastro-esophageal reflux disease without esophagitis: Secondary | ICD-10-CM | POA: Diagnosis not present

## 2021-04-27 DIAGNOSIS — Z6824 Body mass index (BMI) 24.0-24.9, adult: Secondary | ICD-10-CM | POA: Diagnosis not present

## 2021-05-03 DIAGNOSIS — R1013 Epigastric pain: Secondary | ICD-10-CM | POA: Diagnosis not present

## 2021-05-31 DIAGNOSIS — K219 Gastro-esophageal reflux disease without esophagitis: Secondary | ICD-10-CM | POA: Diagnosis not present

## 2021-05-31 DIAGNOSIS — R7989 Other specified abnormal findings of blood chemistry: Secondary | ICD-10-CM | POA: Diagnosis not present

## 2021-05-31 DIAGNOSIS — E7849 Other hyperlipidemia: Secondary | ICD-10-CM | POA: Diagnosis not present

## 2021-05-31 DIAGNOSIS — E7801 Familial hypercholesterolemia: Secondary | ICD-10-CM | POA: Diagnosis not present

## 2021-05-31 DIAGNOSIS — E039 Hypothyroidism, unspecified: Secondary | ICD-10-CM | POA: Diagnosis not present

## 2021-05-31 DIAGNOSIS — E782 Mixed hyperlipidemia: Secondary | ICD-10-CM | POA: Diagnosis not present

## 2021-05-31 DIAGNOSIS — E7800 Pure hypercholesterolemia, unspecified: Secondary | ICD-10-CM | POA: Diagnosis not present

## 2021-05-31 DIAGNOSIS — E1169 Type 2 diabetes mellitus with other specified complication: Secondary | ICD-10-CM | POA: Diagnosis not present

## 2021-06-01 ENCOUNTER — Other Ambulatory Visit: Payer: Self-pay | Admitting: Obstetrics and Gynecology

## 2021-06-01 DIAGNOSIS — Z8249 Family history of ischemic heart disease and other diseases of the circulatory system: Secondary | ICD-10-CM

## 2021-08-28 DIAGNOSIS — L820 Inflamed seborrheic keratosis: Secondary | ICD-10-CM | POA: Diagnosis not present

## 2021-08-28 DIAGNOSIS — E039 Hypothyroidism, unspecified: Secondary | ICD-10-CM | POA: Diagnosis not present

## 2021-11-28 DIAGNOSIS — Z23 Encounter for immunization: Secondary | ICD-10-CM | POA: Diagnosis not present

## 2021-11-30 DIAGNOSIS — E119 Type 2 diabetes mellitus without complications: Secondary | ICD-10-CM | POA: Diagnosis not present

## 2021-11-30 DIAGNOSIS — H43813 Vitreous degeneration, bilateral: Secondary | ICD-10-CM | POA: Diagnosis not present

## 2021-11-30 DIAGNOSIS — H04123 Dry eye syndrome of bilateral lacrimal glands: Secondary | ICD-10-CM | POA: Diagnosis not present

## 2021-12-06 DIAGNOSIS — Z0001 Encounter for general adult medical examination with abnormal findings: Secondary | ICD-10-CM | POA: Diagnosis not present

## 2021-12-06 DIAGNOSIS — E039 Hypothyroidism, unspecified: Secondary | ICD-10-CM | POA: Diagnosis not present

## 2021-12-06 DIAGNOSIS — E7849 Other hyperlipidemia: Secondary | ICD-10-CM | POA: Diagnosis not present

## 2021-12-06 DIAGNOSIS — E1169 Type 2 diabetes mellitus with other specified complication: Secondary | ICD-10-CM | POA: Diagnosis not present

## 2021-12-06 DIAGNOSIS — K219 Gastro-esophageal reflux disease without esophagitis: Secondary | ICD-10-CM | POA: Diagnosis not present

## 2021-12-06 DIAGNOSIS — R5383 Other fatigue: Secondary | ICD-10-CM | POA: Diagnosis not present

## 2022-04-09 ENCOUNTER — Other Ambulatory Visit: Payer: Self-pay | Admitting: Obstetrics and Gynecology

## 2022-04-09 DIAGNOSIS — Z1231 Encounter for screening mammogram for malignant neoplasm of breast: Secondary | ICD-10-CM | POA: Diagnosis not present

## 2022-04-09 DIAGNOSIS — Z6824 Body mass index (BMI) 24.0-24.9, adult: Secondary | ICD-10-CM | POA: Diagnosis not present

## 2022-04-09 DIAGNOSIS — Z124 Encounter for screening for malignant neoplasm of cervix: Secondary | ICD-10-CM | POA: Diagnosis not present

## 2022-04-09 DIAGNOSIS — Z8249 Family history of ischemic heart disease and other diseases of the circulatory system: Secondary | ICD-10-CM

## 2022-05-03 ENCOUNTER — Ambulatory Visit
Admission: RE | Admit: 2022-05-03 | Discharge: 2022-05-03 | Disposition: A | Discharge status: Home / Self Care | Payer: No Typology Code available for payment source | Source: Ambulatory Visit | Attending: Obstetrics and Gynecology | Admitting: Obstetrics and Gynecology

## 2022-05-03 DIAGNOSIS — Z8249 Family history of ischemic heart disease and other diseases of the circulatory system: Secondary | ICD-10-CM

## 2022-05-25 ENCOUNTER — Encounter: Payer: Self-pay | Admitting: Emergency Medicine

## 2022-05-25 ENCOUNTER — Ambulatory Visit (INDEPENDENT_AMBULATORY_CARE_PROVIDER_SITE_OTHER): Payer: Medicare Other | Admitting: Emergency Medicine

## 2022-05-25 VITALS — BP 120/68 | HR 70 | Ht 65.0 in | Wt 149.0 lb

## 2022-05-25 DIAGNOSIS — R911 Solitary pulmonary nodule: Secondary | ICD-10-CM

## 2022-05-25 NOTE — Addendum Note (Signed)
Addended by: Phillips Grout on: 05/25/2022 02:27 PM   Modules accepted: Orders

## 2022-05-25 NOTE — Patient Instructions (Signed)
We will perform a CT scan of the chest in June 2024 to compare with your prior. Please follow Dr. Delton Coombes in June after your CT so we can review the results together.

## 2022-05-25 NOTE — Assessment & Plan Note (Signed)
7 mm right middle lobe pulmonary nodule with benign characteristics and a low risk patient.  This was found on a coronary calcium screening CT.  I would like to perform a dedicated CT chest so I can see all of her lung parenchyma and determine whether there are any other nodules present.  If not then we will plan serial imaging, likely next scan in 12 months.  If there is an interval change then we will arrange for appropriate diagnostics.

## 2022-05-25 NOTE — Progress Notes (Signed)
Subjective:    Patient ID: Kendra [name], female    DOB: 1956-01-24, 67 y.o.   MRN: [id number]  HPI 67 year old woman with minimal history of tobacco (10 pack years), diabetes, hyperlipidemia, asthma and allergies.  She is referred today for evaluation of an abnormal CT scan of the chest.  She underwent a coronary calcium scoring CT on 05/03/2022.   Coronary calcium CT scan of the chest 05/03/2022 reviewed by me shows no mediastinal adenopathy.  There is a solid subpleural 7 mm pulmonary nodule in the right middle lobe and some mild bibasilar atelectasis   Review of Systems As per HPI  Past Medical History:  Diagnosis Date   Anxiety    Arthritis    Asthma    Cataracts, bilateral    Depression    Diabetes type 2, controlled    Hyperlipidemia    Seasonal allergies      Family History  Problem Relation Age of Onset   Breast cancer Paternal Grandmother    Colon cancer Neg Hx    Esophageal cancer Neg Hx    Rectal cancer Neg Hx    Prostate cancer Neg Hx     RA runs in her family Paternal GF had lung CA.   Social History   Socioeconomic History   Marital status: Single    Spouse name: Not on file   Number of children: Not on file   Years of education: Not on file   Highest education level: Not on file  Occupational History   Not on file  Tobacco Use   Smoking status: Former    Types: Cigarettes    Quit date: 02/12/1981    Years since quitting: 41.3   Smokeless tobacco: Never  Substance and Sexual Activity   Alcohol use: Yes    Alcohol/week: 0.0 standard drinks of alcohol    Comment: rare   Drug use: No   Sexual activity: Not on file  Other Topics Concern   Not on file  Social History Narrative   Not on file   Social Determinants of Health   Financial Resource Strain: Not on file  Food Insecurity: Not on file  Transportation Needs: Not on file  Physical Activity: Not on file  Stress: Not on file  Social Connections: Not on file  Intimate Partner  Violence: Not on file    Has lived in [place] and [place] Office work in Community education officer No military Has a dog, her daughter has a parrot that she occasionally cares for No hot tub No mold  No Known Allergies   Outpatient Medications Prior to Visit  Medication Sig Dispense Refill   cyclobenzaprine (FLEXERIL) 10 MG tablet Take 1 tablet (10 mg total) by mouth 2 (two) times daily as needed for muscle spasms. 20 tablet 0   hydrOXYzine (ATARAX/VISTARIL) 25 MG tablet Take 1 tablet (25 mg total) by mouth every 6 (six) hours as needed for anxiety. 30 tablet 0   lisinopril (PRINIVIL,ZESTRIL) 5 MG tablet Take 5 mg by mouth at bedtime.      metFORMIN (GLUCOPHAGE) 1000 MG tablet Take 1,000 mg by mouth 2 (two) times daily.  3   naproxen (NAPROSYN) 500 MG tablet Take 500 mg by mouth 4 (four) times daily as needed (pain).      omega-3 acid ethyl esters (LOVAZA) 1 G capsule Take 4 g by mouth at bedtime.      rosuvastatin (CRESTOR) 10 MG tablet Take 10 mg by mouth at bedtime.  sertraline (ZOLOFT) 50 MG tablet Take 3 tablets (150 mg total) by mouth daily. 90 tablet 0   traZODone (DESYREL) 50 MG tablet Take 1 tablet (50 mg total) by mouth at bedtime and may repeat dose one time if needed. 30 tablet 0   No facility-administered medications prior to visit.        Objective:   Physical Exam Vitals:   05/25/22 1353  BP: 120/68  Pulse: 70  SpO2: 96%  Weight: 149 lb (67.6 kg)  Height:  (1.651 m)   Gen: Pleasant, well-nourished, in no distress,  normal affect  ENT: No lesions,  mouth clear,  oropharynx clear, no postnasal drip  Neck: No JVD, no stridor  Lungs: No use of accessory muscles, no crackles or wheezing on normal respiration, no wheeze on forced expiration  Cardiovascular: RRR, heart sounds normal, no murmur or gallops, no peripheral edema  Musculoskeletal: No deformities, no cyanosis or clubbing  Neuro: alert, awake, non focal  Skin: Warm, no lesions or rash       Assessment & Plan:   Pulmonary nodule 7 mm right middle lobe pulmonary nodule with benign characteristics and a low risk patient.  This was found on a coronary calcium screening CT.  I would like to perform a dedicated CT chest so I can see all of her lung parenchyma and determine whether there are any other nodules present.  If not then we will plan serial imaging, likely next scan in 12 months.  If there is an interval change then we will arrange for appropriate diagnostics.   Levy Pupa, MD, PhD 05/25/2022, 2:24 PM [facility] [phone] or if no answer before 7:00PM call [phone] For any issues after 7:00PM please call eLink [phone]

## 2022-05-30 DIAGNOSIS — R7989 Other specified abnormal findings of blood chemistry: Secondary | ICD-10-CM | POA: Diagnosis not present

## 2022-05-30 DIAGNOSIS — E039 Hypothyroidism, unspecified: Secondary | ICD-10-CM | POA: Diagnosis not present

## 2022-05-30 DIAGNOSIS — E7800 Pure hypercholesterolemia, unspecified: Secondary | ICD-10-CM | POA: Diagnosis not present

## 2022-05-30 DIAGNOSIS — E7801 Familial hypercholesterolemia: Secondary | ICD-10-CM | POA: Diagnosis not present

## 2022-05-30 DIAGNOSIS — E559 Vitamin D deficiency, unspecified: Secondary | ICD-10-CM | POA: Diagnosis not present

## 2022-05-30 DIAGNOSIS — E782 Mixed hyperlipidemia: Secondary | ICD-10-CM | POA: Diagnosis not present

## 2022-05-30 DIAGNOSIS — E7849 Other hyperlipidemia: Secondary | ICD-10-CM | POA: Diagnosis not present

## 2022-05-30 DIAGNOSIS — E1169 Type 2 diabetes mellitus with other specified complication: Secondary | ICD-10-CM | POA: Diagnosis not present

## 2022-05-30 DIAGNOSIS — K219 Gastro-esophageal reflux disease without esophagitis: Secondary | ICD-10-CM | POA: Diagnosis not present

## 2022-06-04 ENCOUNTER — Ambulatory Visit: Payer: Medicare Other | Attending: Cardiovascular Disease | Admitting: Cardiovascular Disease

## 2022-06-04 ENCOUNTER — Encounter: Payer: Self-pay | Admitting: Cardiovascular Disease

## 2022-06-04 VITALS — BP 128/82 | HR 73 | Ht 65.0 in | Wt 146.2 lb

## 2022-06-04 DIAGNOSIS — E119 Type 2 diabetes mellitus without complications: Secondary | ICD-10-CM

## 2022-06-04 DIAGNOSIS — E7800 Pure hypercholesterolemia, unspecified: Secondary | ICD-10-CM | POA: Insufficient documentation

## 2022-06-04 DIAGNOSIS — I2510 Atherosclerotic heart disease of native coronary artery without angina pectoris: Secondary | ICD-10-CM | POA: Diagnosis not present

## 2022-06-04 MED ORDER — ROSUVASTATIN CALCIUM 20 MG PO TABS: 20.0000 mg | ORAL_TABLET | Freq: Every day | ORAL | 3 refills | Status: DC

## 2022-06-04 NOTE — Patient Instructions (Signed)
Medication Instructions:  Increase Rosuvastatin to  a day *If you need a refill on your cardiac medications before your next appointment, please call your pharmacy*   Lab Work: Lipid panel- 3 months- fasting lab work- nothing to eat or drink after midnight. Please return for Blood Work in 3 months. No appointment needed, lab here at the office is open Monday-Friday from 8AM to 4PM.   If you have labs (blood work) drawn today and your tests are completely normal, you will receive your results only by: MyChart Message (if you have MyChart) OR A paper copy in the mail If you have any lab test that is abnormal or we need to change your treatment, we will call you to review the results.   Follow-Up: At [facility], you and your health needs are our priority.  As part of our continuing mission to provide you with exceptional heart care, we have created designated Provider Care Teams.  These Care Teams include your primary Cardiologist (physician) and Advanced Practice Providers (APPs -  Physician Assistants and Nurse Practitioners) who all work together to provide you with the care you need, when you need it.  We recommend signing up for the patient portal called "MyChart".  Sign up information is provided on this After Visit Summary.  MyChart is used to connect with patients for Virtual Visits (Telemedicine).  Patients are able to view lab/test results, encounter notes, upcoming appointments, etc.  Non-urgent messages can be sent to your provider as well.   To learn more about what you can do with MyChart, go to [email or web address].    Your next appointment:   1 year(s)  Provider:   Dr Royann Shivers

## 2022-06-04 NOTE — Progress Notes (Signed)
Cardiology Office Note:    Date:  06/04/2022   ID:  Kendra [name], DOB [birth year], MRN [id number]  PCP:  Juliette Alcide, MD    HeartCare Providers Cardiologist:  None     Referring MD: Richardean Chimera, MD   Chief Complaint  Patient presents with   Consult  Kendra [name] is a 67 y.o. female who is being seen today for the evaluation of coronary calcium score at the request of Richardean Chimera, MD.   History of Present Illness:    Kendra [name] is a 67 y.o. female with a hx of type 2 diabetes mellitus, hypercholesterolemia, family history of early onset CAD referred for evaluation after coronary calcium score showed mild elevation (total score 10.2, 33rd percentile).  She has well-controlled diabetes mellitus with a hemoglobin A1c of 6.5% on metformin monotherapy.  She does not smoke.  Her blood pressure is normal.  She has mild hypercholesterolemia with a total cholesterol of 219, HDL of 73, triglycerides 165, LDL 118 (on rosuvastatin 10 mg once daily).  Labs performed in 2019 showed an HDL of 60 and LDL of 88.  She believes that at that time she was under a lot of emotional stress due to the terminal illnesses of her brother and her husband and that she had lost a lot of weight.  The patient specifically denies any chest pain at rest exertion, dyspnea at rest or with exertion, orthopnea, paroxysmal nocturnal dyspnea, syncope, palpitations, focal neurological deficits, intermittent claudication, lower extremity edema, unexplained weight gain, cough, hemoptysis or wheezing.  She walks her standard poodle Sherilyn Cooter 2-3 miles a day, every day regardless of the weather.  Her father was a very Retail banker and cross-country ski year who nevertheless died suddenly from a myocardial infarction at age 4.  He was a heavy smoker.  Her mother and multiple maternal family members have diabetes mellitus.  She has a brother who passed from congestive heart failure around the age of 74  but he had rheumatic fever as a child which was may be the cause of his heart disease.  Past Medical History:  Diagnosis Date   Anxiety    Arthritis    Asthma    Cataracts, bilateral    Depression    Diabetes type 2, controlled    Hyperlipidemia    Seasonal allergies     Past Surgical History:  Procedure Laterality Date   BLEPHAROPLASTY  2009   CATARACT EXTRACTION, BILATERAL  2011, 2013   lumbar disc removal  1991    Current Medications: Current Meds  Medication Sig   albuterol (VENTOLIN HFA) 108 (90 Base) MCG/ACT inhaler Inhale into the lungs every 6 (six) hours as needed for wheezing or shortness of breath.   BIOTIN PO Take by mouth.   metFORMIN (GLUCOPHAGE) 500 MG tablet Take 500 mg by mouth daily.   rosuvastatin (CRESTOR) 20 MG tablet Take 1 tablet (20 mg total) by mouth daily.   sertraline (ZOLOFT) 25 MG tablet Take 25 mg by mouth daily.     Allergies:   Patient has no known allergies.   Social History   Socioeconomic History   Marital status: Single    Spouse name: Not on file   Number of children: Not on file   Years of education: Not on file   Highest education level: Not on file  Occupational History   Not on file  Tobacco Use   Smoking status: Former    Types: Cigarettes  Quit date: 02/12/1981    Years since quitting: 41.3   Smokeless tobacco: Never  Substance and Sexual Activity   Alcohol use: Yes    Alcohol/week: 0.0 standard drinks of alcohol    Comment: rare   Drug use: No   Sexual activity: Not on file  Other Topics Concern   Not on file  Social History Narrative   Not on file   Social Determinants of Health   Financial Resource Strain: Not on file  Food Insecurity: Not on file  Transportation Needs: Not on file  Physical Activity: Not on file  Stress: Not on file  Social Connections: Not on file     Family History: The patient's family history includes Breast cancer in her paternal grandmother. There is no history of Colon cancer,  Esophageal cancer, Rectal cancer, or Prostate cancer.  ROS:   Please see the history of present illness.     All other systems reviewed and are negative.  EKGs/Labs/Other Studies Reviewed:    The following studies were reviewed today: Coronary CT calcium score 05/03/2022  1. Atherosclerotic calcification about the proximal left anterior descending coronary artery. The observed calcium score of 10.2 is at the thirty-third percentile for subjects of the same age, gender, and race/ethnicity who are free of clinical cardiovascular disease and treated diabetes. 2. Solid pulmonary nodule in the right middle lobe measuring up to 7 mm. Non-contrast chest CT at 6-12 months is recommended. If the nodule is stable at time of repeat CT, then future CT at 18-24 months (from today's scan) is considered optional for low-risk patients, but is recommended for high-risk patients.   EKG:  EKG is  ordered today.  The ekg ordered today demonstrates normal sinus rhythm, RSR prime pattern in lead V1 with normal QRS duration of 92 ms, normal tracing.  QTc 447 ms.  Recent Labs: No results found for requested labs within last 365 days.  Recent Lipid Panel    Component Value Date/Time   CHOL 179 08/15/2017 0648   TRIG 153 (H) 08/15/2017 0648   HDL 60 08/15/2017 0648   CHOLHDL 3.0 08/15/2017 0648   VLDL 31 08/15/2017 0648   LDLCALC 88 08/15/2017 0648   05/30/2022 Cholesterol 219, HDL 73, triglycerides 165, calculated LDL 118 Creatinine 0.9, potassium 4.7, TSH 1.902  Risk Assessment/Calculations:                Physical Exam:    VS:  BP 128/82   Pulse 73   Ht  (1.651 m)   Wt 146 lb 3.2 oz (66.3 kg)   SpO2 98%   BMI 24.33 kg/m     Wt Readings from Last 3 Encounters:  06/04/22 146 lb 3.2 oz (66.3 kg)  05/25/22 149 lb (67.6 kg)  08/13/17 142 lb (64.4 kg)     GEN:  Well nourished, well developed in no acute distress HEENT: Normal NECK: No JVD; No carotid bruits LYMPHATICS: No  lymphadenopathy CARDIAC: RRR, no murmurs, rubs, gallops RESPIRATORY:  Clear to auscultation without rales, wheezing or rhonchi  ABDOMEN: Soft, non-tender, non-distended MUSCULOSKELETAL:  No edema; No deformity  SKIN: Warm and dry NEUROLOGIC:  Alert and oriented x 3 PSYCHIATRIC:  Normal affect   ASSESSMENT:    1. Coronary artery calcification seen on CT scan   2. Hypercholesterolemia   3. Type 2 diabetes mellitus without complication, without long-term current use of insulin    PLAN:    In order of problems listed above:  Coronary calcification: Less than  average for age.  She does not have any symptoms of angina pectoris despite being very physically active.  Angiographic evaluation does not appear to be necessary at this time. HLP: Not withstanding the lower than average coronary calcium score she does have diabetes mellitus and a family history of CAD.  Would shoot for target LDL cholesterol less than 161.  Increase rosuvastatin to 20 mg daily.  I suspect I will get [place] to target.  Recheck labs in roughly 2-3 months. DM: Adequate control with minimal pharmacological intervention.  Continue regular physical exercise, with a diet low in sugars and starches with high glycemic index, keep weight in optimal range, if more pharmacological therapy is necessary prefer SGLT2 inhibitors or GLP-1 agonists over older agents such as sulfonylureas.           Medication Adjustments/Labs and Tests Ordered: Current medicines are reviewed at length with the patient today.  Concerns regarding medicines are outlined above.  Orders Placed This Encounter  Procedures   Lipid panel   EKG 12-Lead   Meds ordered this encounter  Medications   rosuvastatin (CRESTOR) 20 MG tablet    Sig: Take 1 tablet (20 mg total) by mouth daily.    Dispense:  90 tablet    Refill:  3    Patient Instructions  Medication Instructions:  Increase Rosuvastatin to 20mg  a day *If you need a refill on your cardiac  medications before your next appointment, please call your pharmacy*   Lab Work: Lipid panel- 3 months- fasting lab work- nothing to eat or drink after midnight. Please return for Blood Work in 3 months. No appointment needed, lab here at the office is open Monday-Friday from 8AM to 4PM.   If you have labs (blood work) drawn today and your tests are completely normal, you will receive your results only by: MyChart Message (if you have MyChart) OR A paper copy in the mail If you have any lab test that is abnormal or we need to change your treatment, we will call you to review the results.   Follow-Up: At [facility], you and your health needs are our priority.  As part of our continuing mission to provide you with exceptional heart care, we have created designated Provider Care Teams.  These Care Teams include your primary Cardiologist (physician) and Advanced Practice Providers (APPs -  Physician Assistants and Nurse Practitioners) who all work together to provide you with the care you need, when you need it.  We recommend signing up for the patient portal called "MyChart".  Sign up information is provided on this After Visit Summary.  MyChart is used to connect with patients for Virtual Visits (Telemedicine).  Patients are able to view lab/test results, encounter notes, upcoming appointments, etc.  Non-urgent messages can be sent to your provider as well.   To learn more about what you can do with MyChart, go to [email or web address].    Your next appointment:   1 year(s)  Provider:   Dr Royann Shivers    Signed, Thurmon Fair, MD  06/04/2022 5:26 PM    [facility]

## 2022-06-18 DIAGNOSIS — M79644 Pain in right finger(s): Secondary | ICD-10-CM | POA: Diagnosis not present

## 2022-06-28 DIAGNOSIS — M1811 Unilateral primary osteoarthritis of first carpometacarpal joint, right hand: Secondary | ICD-10-CM | POA: Diagnosis not present

## 2022-06-28 DIAGNOSIS — M79644 Pain in right finger(s): Secondary | ICD-10-CM | POA: Diagnosis not present

## 2022-07-04 DIAGNOSIS — M25341 Other instability, right hand: Secondary | ICD-10-CM | POA: Diagnosis not present

## 2022-07-04 DIAGNOSIS — M1811 Unilateral primary osteoarthritis of first carpometacarpal joint, right hand: Secondary | ICD-10-CM | POA: Diagnosis not present

## 2022-07-04 DIAGNOSIS — M19041 Primary osteoarthritis, right hand: Secondary | ICD-10-CM | POA: Diagnosis not present

## 2022-07-17 DIAGNOSIS — M1811 Unilateral primary osteoarthritis of first carpometacarpal joint, right hand: Secondary | ICD-10-CM | POA: Diagnosis not present

## 2022-07-25 ENCOUNTER — Ambulatory Visit (HOSPITAL_COMMUNITY)
Admission: RE | Admit: 2022-07-25 | Discharge: 2022-07-25 | Disposition: A | Discharge status: Home / Self Care | Payer: Medicare Other | Source: Ambulatory Visit | Attending: Emergency Medicine | Admitting: Emergency Medicine

## 2022-07-25 DIAGNOSIS — R911 Solitary pulmonary nodule: Secondary | ICD-10-CM | POA: Diagnosis not present

## 2022-07-25 DIAGNOSIS — J8410 Pulmonary fibrosis, unspecified: Secondary | ICD-10-CM | POA: Diagnosis not present

## 2022-07-25 DIAGNOSIS — R918 Other nonspecific abnormal finding of lung field: Secondary | ICD-10-CM | POA: Diagnosis not present

## 2022-07-30 DIAGNOSIS — L820 Inflamed seborrheic keratosis: Secondary | ICD-10-CM | POA: Diagnosis not present

## 2022-08-14 DIAGNOSIS — M1811 Unilateral primary osteoarthritis of first carpometacarpal joint, right hand: Secondary | ICD-10-CM | POA: Diagnosis not present

## 2022-08-19 DIAGNOSIS — M19041 Primary osteoarthritis, right hand: Secondary | ICD-10-CM | POA: Diagnosis not present

## 2022-08-19 DIAGNOSIS — M25541 Pain in joints of right hand: Secondary | ICD-10-CM | POA: Diagnosis not present

## 2022-08-19 DIAGNOSIS — M25641 Stiffness of right hand, not elsewhere classified: Secondary | ICD-10-CM | POA: Diagnosis not present

## 2022-08-20 DIAGNOSIS — M19041 Primary osteoarthritis, right hand: Secondary | ICD-10-CM | POA: Diagnosis not present

## 2022-08-20 DIAGNOSIS — M25541 Pain in joints of right hand: Secondary | ICD-10-CM | POA: Diagnosis not present

## 2022-08-20 DIAGNOSIS — M25641 Stiffness of right hand, not elsewhere classified: Secondary | ICD-10-CM | POA: Diagnosis not present

## 2022-08-30 ENCOUNTER — Ambulatory Visit: Payer: Medicare Other | Admitting: Emergency Medicine

## 2022-08-30 ENCOUNTER — Encounter: Payer: Self-pay | Admitting: Emergency Medicine

## 2022-08-30 VITALS — BP 118/68 | HR 50 | Temp 98.5°F | Ht 65.0 in | Wt 148.2 lb

## 2022-08-30 DIAGNOSIS — J4520 Mild intermittent asthma, uncomplicated: Secondary | ICD-10-CM | POA: Diagnosis not present

## 2022-08-30 DIAGNOSIS — R911 Solitary pulmonary nodule: Secondary | ICD-10-CM

## 2022-08-30 NOTE — Patient Instructions (Signed)
We reviewed your CT scan of the chest today.  This is stable compared with your prior.  Good news. We will plan to repeat a CT scan of the chest in March 2025. Follow Dr. Delton Coombes in March after your CT chest so we can review the results together.  Call sooner if you have any problems.

## 2022-08-30 NOTE — Addendum Note (Signed)
Addended by: Dorisann Frames R on: 08/30/2022 01:36 PM   Modules accepted: Orders

## 2022-08-30 NOTE — Assessment & Plan Note (Signed)
Doing well. Asymptomatic except w specific triggers, which she avoids. Albuterol available prn.

## 2022-08-30 NOTE — Assessment & Plan Note (Signed)
Stable 7 mm right middle lobe nodule over a short 24-month interval, reassuring.  Also calcified lingular granuloma, 3 mm left upper lobe nodule now identified.  She has a family history of lung cancer, minimal tobacco history.  Recommend that we repeat her CT chest at the 40-month mark (9 months from now, March 2025).  We will plan to follow for stability at least 2 years before we deem these benign.

## 2022-08-30 NOTE — Progress Notes (Signed)
   Subjective:    Patient ID: Kendra [name], female    DOB: April 25, 1955, 67 y.o.   MRN: [id number]  HPI  ROV 08/30/2022 --follow-up visit for 67 year old woman for pulmonary nodule that was spuriously found on a coronary calcium scoring CT scan of her chest.  She has a history of minimal tobacco, diabetes, hyperlipidemia, asthma and allergies.  We repeated her CT scan of the chest 07/25/2022 as below Allergies, is on loratadine.  Has albuterol available and uses approximately.  She feels that she is doing well. She can sometimes get chest tightness, wheeze when exposed to cats. Otherwise never needs albuterol.   CT scan of the chest 07/25/2022 reviewed by me, shows no mediastinal or hilar adenopathy, no change in a 7 mm right middle lobe pulmonary nodule.  There is a calcified lingular granuloma and a 3 mm left upper lobe pulmonary nodule that was not imaged on the coronary scan   Review of Systems As per HPI      Objective:   Physical Exam Vitals:   08/30/22 1314  BP: 118/68  Pulse: (!) 50  Temp: 98.5 F (36.9 C)  TempSrc: Oral  SpO2: 94%  Weight: 148 lb 3.2 oz (67.2 kg)  Height: 5\' 5"  (1.651 m)    Gen: Pleasant, well-nourished, in no distress,  normal affect  ENT: No lesions,  mouth clear,  oropharynx clear, no postnasal drip  Neck: No JVD, no stridor  Lungs: No use of accessory muscles, no crackles or wheezing on normal respiration, no wheeze on forced expiration  Cardiovascular: RRR, heart sounds normal, no murmur or gallops, no peripheral edema  Musculoskeletal: No deformities, no cyanosis or clubbing  Neuro: alert, awake, non focal  Skin: Warm, no lesions or rash       Assessment & Plan:  Pulmonary nodule Stable 7 mm right middle lobe nodule over a short 35-month interval, reassuring.  Also calcified lingular granuloma, 3 mm left upper lobe nodule now identified.  She has a family history of lung cancer, minimal tobacco history.  Recommend that we repeat her CT  chest at the 103-month mark (9 months from now, March 2025).  We will plan to follow for stability at least 2 years before we deem these benign.  Mild intermittent asthma Doing well. Asymptomatic except w specific triggers, which she avoids. Albuterol available prn.     Levy Pupa, MD, PhD 08/30/2022, 1:25 PM [facility] [phone] or if no answer before 7:00PM call [phone] For any issues after 7:00PM please call eLink [phone]

## 2022-09-13 DIAGNOSIS — M19041 Primary osteoarthritis, right hand: Secondary | ICD-10-CM | POA: Diagnosis not present

## 2022-09-13 DIAGNOSIS — M1811 Unilateral primary osteoarthritis of first carpometacarpal joint, right hand: Secondary | ICD-10-CM | POA: Diagnosis not present

## 2022-09-13 DIAGNOSIS — M25641 Stiffness of right hand, not elsewhere classified: Secondary | ICD-10-CM | POA: Diagnosis not present

## 2022-09-13 DIAGNOSIS — M25541 Pain in joints of right hand: Secondary | ICD-10-CM | POA: Diagnosis not present

## 2022-10-23 DIAGNOSIS — M1811 Unilateral primary osteoarthritis of first carpometacarpal joint, right hand: Secondary | ICD-10-CM | POA: Diagnosis not present

## 2022-10-28 DIAGNOSIS — Z23 Encounter for immunization: Secondary | ICD-10-CM | POA: Diagnosis not present

## 2022-12-11 DIAGNOSIS — M65311 Trigger thumb, right thumb: Secondary | ICD-10-CM | POA: Diagnosis not present

## 2022-12-11 DIAGNOSIS — M1811 Unilateral primary osteoarthritis of first carpometacarpal joint, right hand: Secondary | ICD-10-CM | POA: Diagnosis not present

## 2022-12-12 DIAGNOSIS — E7849 Other hyperlipidemia: Secondary | ICD-10-CM | POA: Diagnosis not present

## 2022-12-12 DIAGNOSIS — E039 Hypothyroidism, unspecified: Secondary | ICD-10-CM | POA: Diagnosis not present

## 2022-12-12 DIAGNOSIS — R7989 Other specified abnormal findings of blood chemistry: Secondary | ICD-10-CM | POA: Diagnosis not present

## 2022-12-12 DIAGNOSIS — E1169 Type 2 diabetes mellitus with other specified complication: Secondary | ICD-10-CM | POA: Diagnosis not present

## 2022-12-12 DIAGNOSIS — E7801 Familial hypercholesterolemia: Secondary | ICD-10-CM | POA: Diagnosis not present

## 2022-12-13 DIAGNOSIS — E119 Type 2 diabetes mellitus without complications: Secondary | ICD-10-CM | POA: Diagnosis not present

## 2022-12-13 DIAGNOSIS — H04123 Dry eye syndrome of bilateral lacrimal glands: Secondary | ICD-10-CM | POA: Diagnosis not present

## 2022-12-13 DIAGNOSIS — H43813 Vitreous degeneration, bilateral: Secondary | ICD-10-CM | POA: Diagnosis not present

## 2023-01-15 DIAGNOSIS — M65311 Trigger thumb, right thumb: Secondary | ICD-10-CM | POA: Diagnosis not present

## 2023-01-23 IMAGING — MG MM DIGITAL DIAGNOSTIC UNILAT*R* W/ TOMO W/ CAD
8 series · 9 of 24 positions shown · non-contrast
Comparison: Previous exam(s).

CLINICAL DATA: The patient was called back for 2 areas of possible
right breast distortion.

EXAM:
DIGITAL DIAGNOSTIC UNILATERAL RIGHT MAMMOGRAM WITH TOMOSYNTHESIS AND
CAD
TECHNIQUE: Right digital diagnostic mammography and breast tomosynthesis was
performed. The images were evaluated with computer-aided detection.

[R CC synth-2D (1 of 2)]
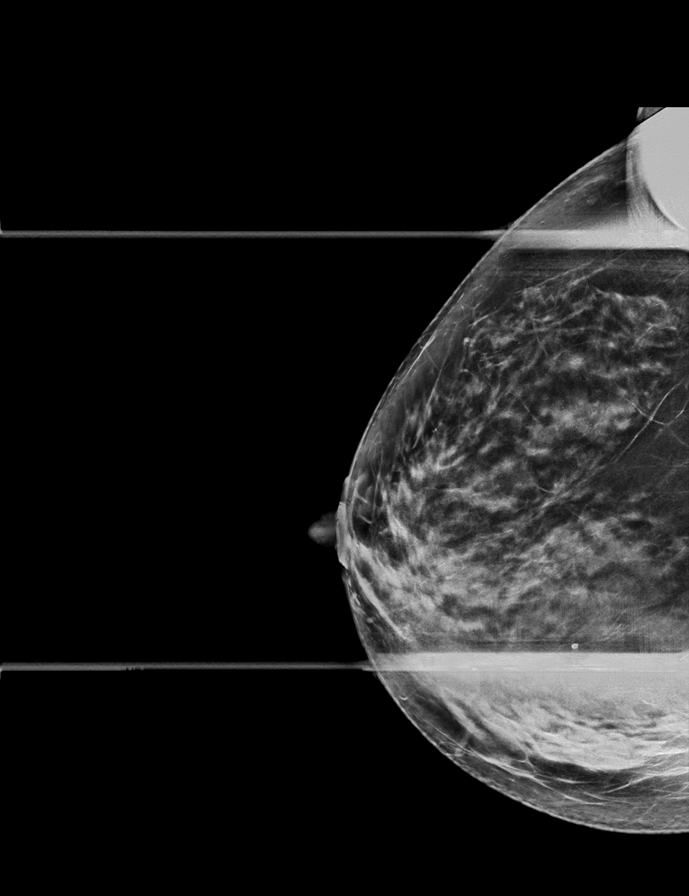

[R MLO synth-2D]
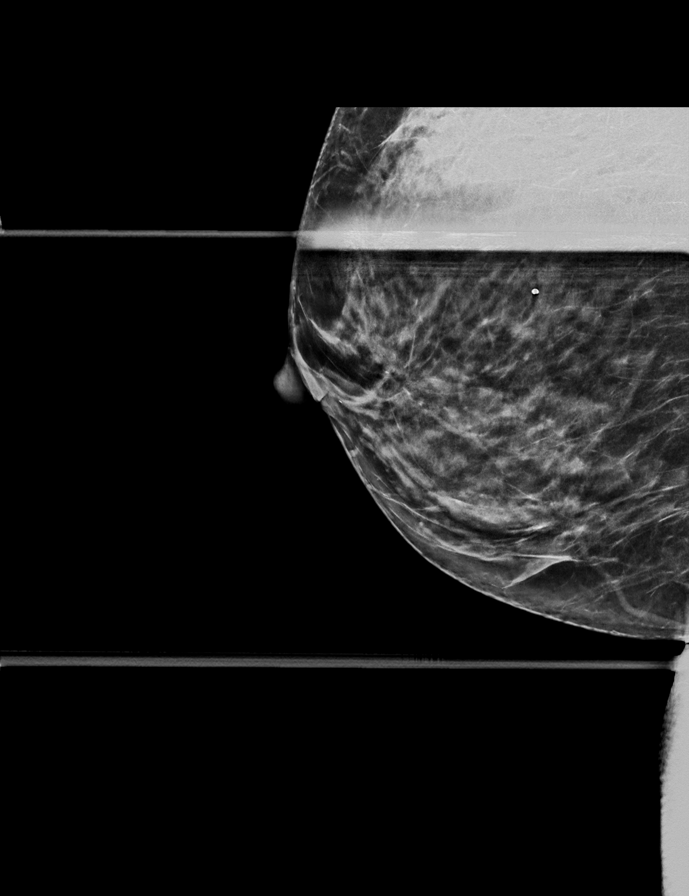

[R CC synth-2D (2 of 2)]
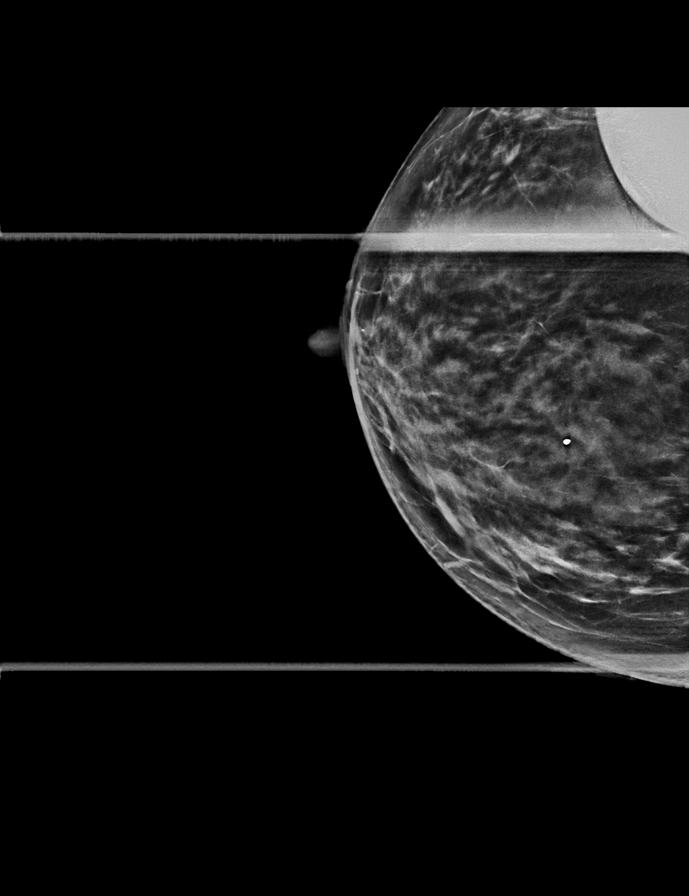

[R ML synth-2D]
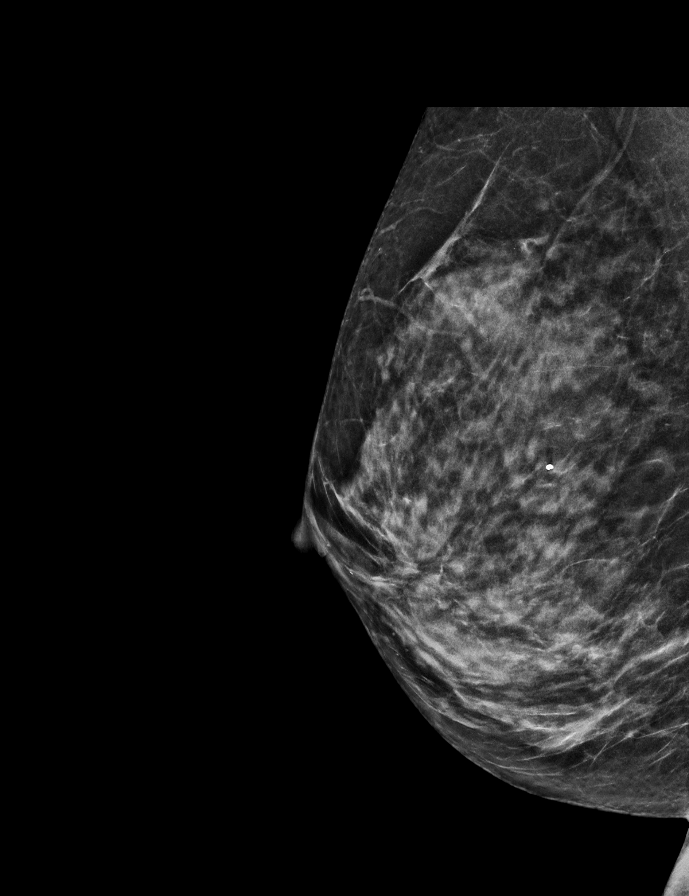

[R CC tomo · 2 of 62 frames shown (1 of 2)]
[frame 21/62]
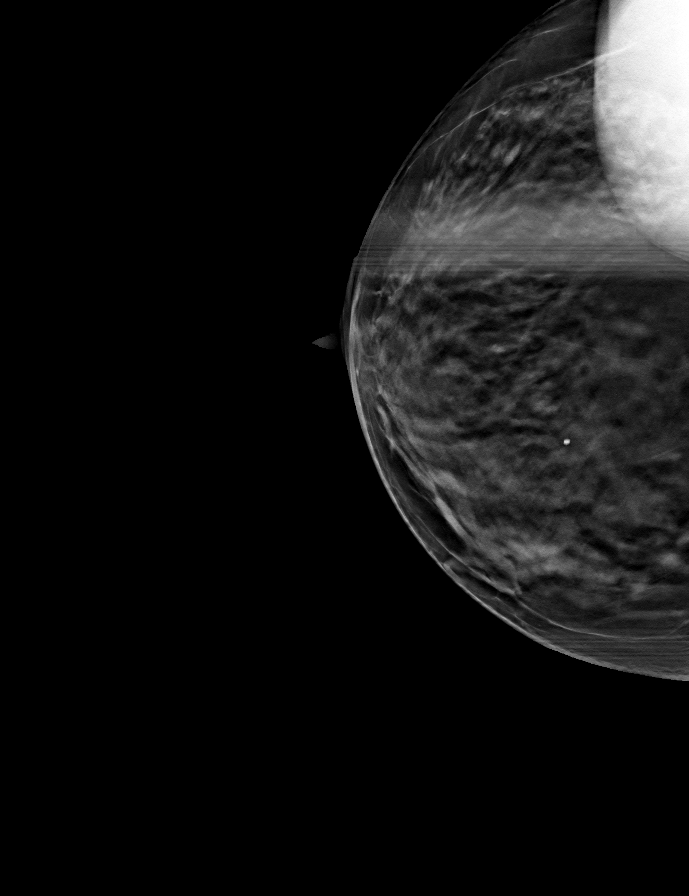
[frame 31/62]
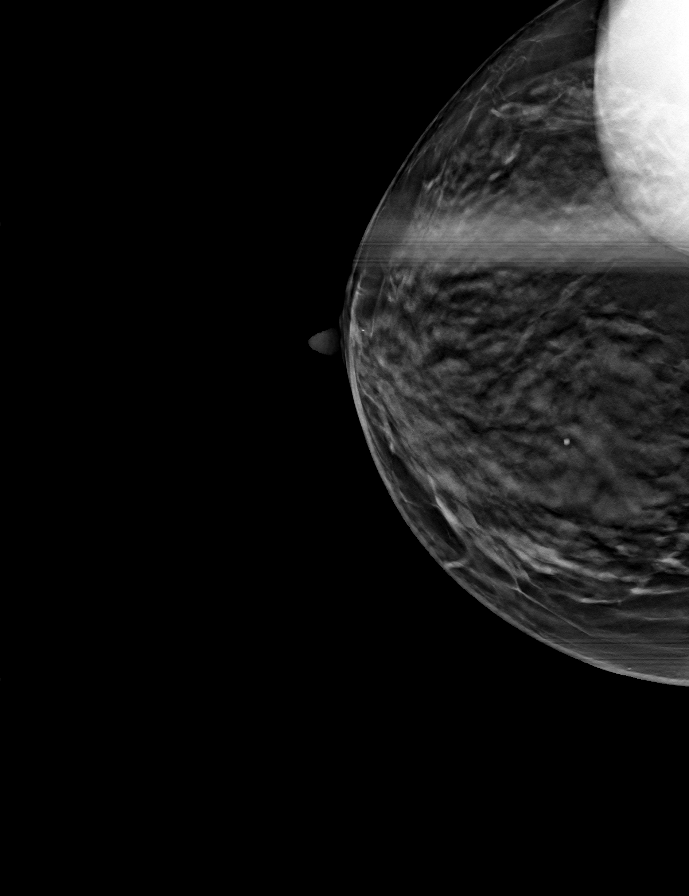

[R ML tomo · tomo slice 27/54.0]
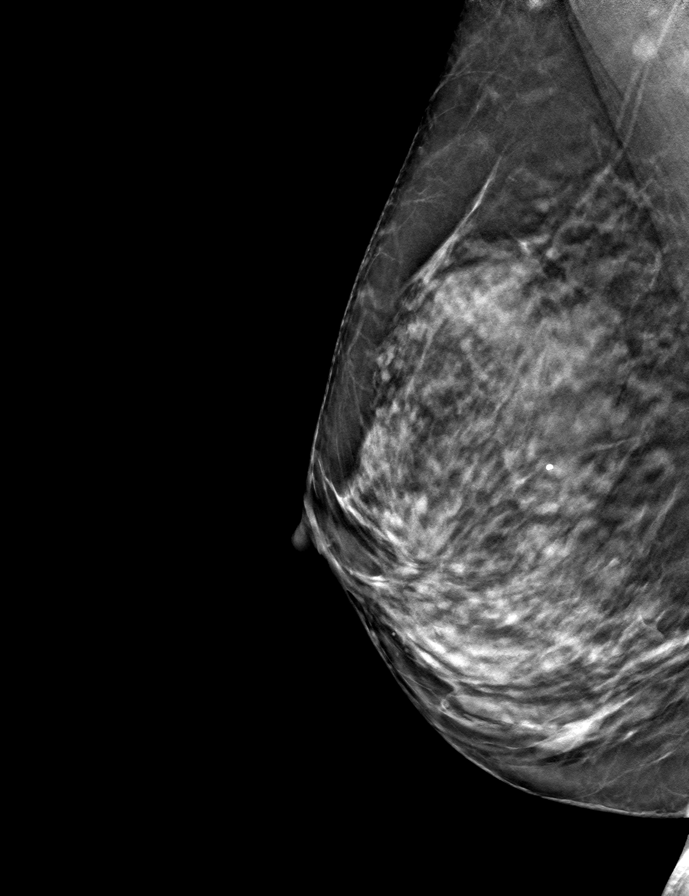

[R MLO tomo · tomo slice 31/60.0]
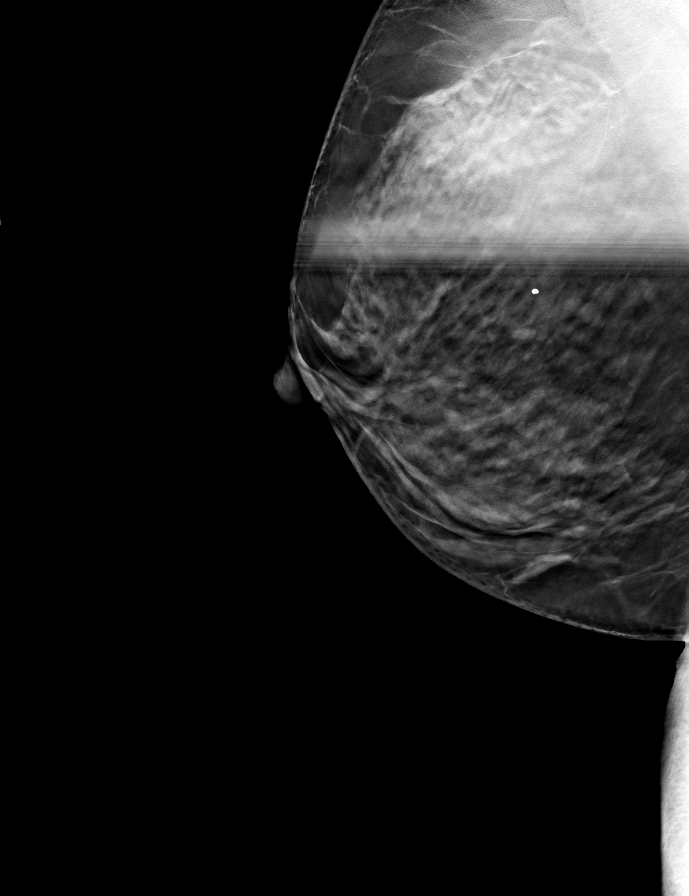

[R CC tomo (2 of 2) · tomo slice 33/65.0]
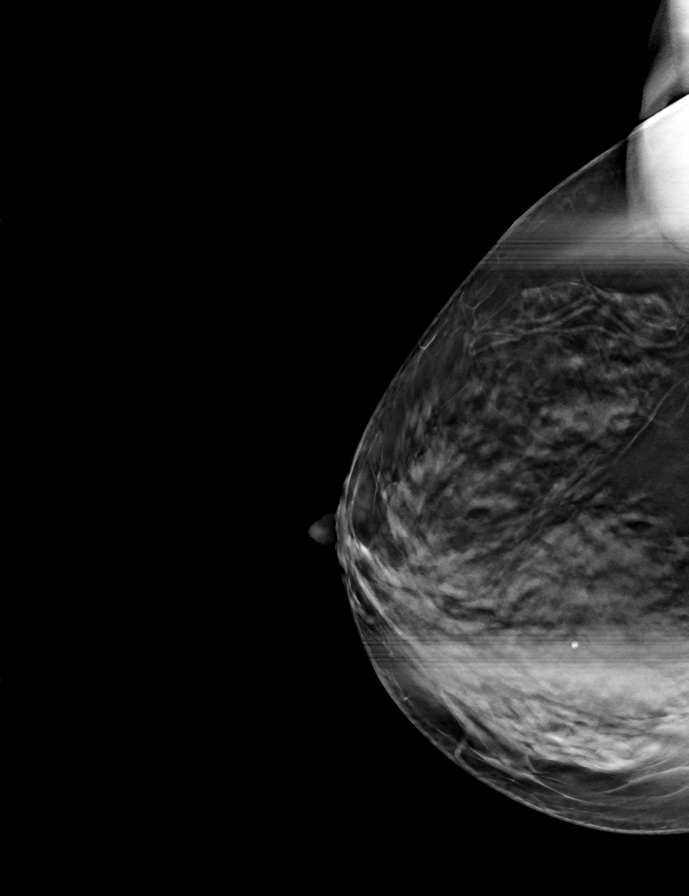

[9 of 24 positions shown; findings below may reference images not displayed]

ACR Breast Density Category c: The breast tissue is heterogeneously
dense, which may obscure small masses.
FINDINGS: No suspicious masses, calcifications, or distortion are identified
in the right breast on today's imaging. No changes since at least
2228.
IMPRESSION: No mammographic evidence of malignancy.

RECOMMENDATION:
Annual screening mammography.

I have discussed the findings and recommendations with the patient.
If applicable, a reminder letter will be sent to the patient
regarding the next appointment.

BI-RADS CATEGORY  2: Benign.

## 2023-04-15 DIAGNOSIS — Z124 Encounter for screening for malignant neoplasm of cervix: Secondary | ICD-10-CM | POA: Diagnosis not present

## 2023-04-15 DIAGNOSIS — Z6824 Body mass index (BMI) 24.0-24.9, adult: Secondary | ICD-10-CM | POA: Diagnosis not present

## 2023-04-16 ENCOUNTER — Ambulatory Visit
Admission: RE | Admit: 2023-04-16 | Discharge: 2023-04-16 | Disposition: A | Discharge status: Home / Self Care | Payer: Medicare Other | Source: Ambulatory Visit | Attending: Emergency Medicine | Admitting: Emergency Medicine

## 2023-04-16 DIAGNOSIS — R911 Solitary pulmonary nodule: Secondary | ICD-10-CM

## 2023-04-16 DIAGNOSIS — I700 Atherosclerosis of aorta: Secondary | ICD-10-CM | POA: Diagnosis not present

## 2023-04-23 DIAGNOSIS — Z1231 Encounter for screening mammogram for malignant neoplasm of breast: Secondary | ICD-10-CM | POA: Diagnosis not present

## 2023-05-16 DIAGNOSIS — M65311 Trigger thumb, right thumb: Secondary | ICD-10-CM | POA: Diagnosis not present

## 2023-06-02 ENCOUNTER — Other Ambulatory Visit: Payer: Self-pay | Admitting: Cardiovascular Disease

## 2023-06-18 DIAGNOSIS — Z0001 Encounter for general adult medical examination with abnormal findings: Secondary | ICD-10-CM | POA: Diagnosis not present

## 2023-06-18 DIAGNOSIS — E7849 Other hyperlipidemia: Secondary | ICD-10-CM | POA: Diagnosis not present

## 2023-06-18 DIAGNOSIS — E119 Type 2 diabetes mellitus without complications: Secondary | ICD-10-CM | POA: Diagnosis not present

## 2023-06-18 DIAGNOSIS — E782 Mixed hyperlipidemia: Secondary | ICD-10-CM | POA: Diagnosis not present

## 2023-06-18 DIAGNOSIS — E559 Vitamin D deficiency, unspecified: Secondary | ICD-10-CM | POA: Diagnosis not present

## 2023-06-18 DIAGNOSIS — E039 Hypothyroidism, unspecified: Secondary | ICD-10-CM | POA: Diagnosis not present

## 2023-06-28 ENCOUNTER — Other Ambulatory Visit: Payer: Self-pay | Admitting: Cardiovascular Disease

## 2023-07-14 ENCOUNTER — Other Ambulatory Visit: Payer: Self-pay | Admitting: Cardiovascular Disease

## 2023-08-01 DIAGNOSIS — M65311 Trigger thumb, right thumb: Secondary | ICD-10-CM | POA: Diagnosis not present

## 2023-08-07 DIAGNOSIS — M65311 Trigger thumb, right thumb: Secondary | ICD-10-CM | POA: Diagnosis not present

## 2023-08-15 ENCOUNTER — Other Ambulatory Visit: Payer: Self-pay | Admitting: Cardiovascular Disease

## 2023-09-04 ENCOUNTER — Other Ambulatory Visit: Payer: Self-pay | Admitting: Cardiovascular Disease

## 2023-10-24 DIAGNOSIS — Z23 Encounter for immunization: Secondary | ICD-10-CM | POA: Diagnosis not present

## 2023-10-30 DIAGNOSIS — R030 Elevated blood-pressure reading, without diagnosis of hypertension: Secondary | ICD-10-CM | POA: Diagnosis not present

## 2023-10-30 DIAGNOSIS — Z6823 Body mass index (BMI) 23.0-23.9, adult: Secondary | ICD-10-CM | POA: Diagnosis not present

## 2023-10-30 DIAGNOSIS — T63481A Toxic effect of venom of other arthropod, accidental (unintentional), initial encounter: Secondary | ICD-10-CM | POA: Diagnosis not present

## 2023-10-30 DIAGNOSIS — R609 Edema, unspecified: Secondary | ICD-10-CM | POA: Diagnosis not present

## 2023-11-25 DIAGNOSIS — Z23 Encounter for immunization: Secondary | ICD-10-CM | POA: Diagnosis not present

## 2023-12-19 DIAGNOSIS — Z961 Presence of intraocular lens: Secondary | ICD-10-CM | POA: Diagnosis not present

## 2023-12-19 DIAGNOSIS — H43813 Vitreous degeneration, bilateral: Secondary | ICD-10-CM | POA: Diagnosis not present

## 2023-12-19 DIAGNOSIS — H04123 Dry eye syndrome of bilateral lacrimal glands: Secondary | ICD-10-CM | POA: Diagnosis not present

## 2023-12-19 DIAGNOSIS — E119 Type 2 diabetes mellitus without complications: Secondary | ICD-10-CM | POA: Diagnosis not present

## 2023-12-23 DIAGNOSIS — L400 Psoriasis vulgaris: Secondary | ICD-10-CM | POA: Diagnosis not present

## 2023-12-23 DIAGNOSIS — L820 Inflamed seborrheic keratosis: Secondary | ICD-10-CM | POA: Diagnosis not present

## 2023-12-26 DIAGNOSIS — R5383 Other fatigue: Secondary | ICD-10-CM | POA: Diagnosis not present

## 2023-12-26 DIAGNOSIS — E039 Hypothyroidism, unspecified: Secondary | ICD-10-CM | POA: Diagnosis not present

## 2023-12-26 DIAGNOSIS — E1169 Type 2 diabetes mellitus with other specified complication: Secondary | ICD-10-CM | POA: Diagnosis not present

## 2023-12-26 DIAGNOSIS — E559 Vitamin D deficiency, unspecified: Secondary | ICD-10-CM | POA: Diagnosis not present

## 2023-12-26 DIAGNOSIS — E7849 Other hyperlipidemia: Secondary | ICD-10-CM | POA: Diagnosis not present

## 2024-01-27 ENCOUNTER — Ambulatory Visit: Admitting: Gastroenterology

## 2024-01-27 ENCOUNTER — Encounter: Payer: Self-pay | Admitting: Gastroenterology

## 2024-01-27 VITALS — BP 130/66 | HR 63 | Ht 65.0 in | Wt 140.2 lb

## 2024-01-27 DIAGNOSIS — K649 Unspecified hemorrhoids: Secondary | ICD-10-CM | POA: Diagnosis not present

## 2024-01-27 DIAGNOSIS — Z1211 Encounter for screening for malignant neoplasm of colon: Secondary | ICD-10-CM

## 2024-01-27 MED ORDER — NA SULFATE-K SULFATE-MG SULF 17.5-3.13-1.6 GM/177ML PO SOLN: 1.0000 | Freq: Once | ORAL | 0 refills | Status: AC

## 2024-01-27 NOTE — Progress Notes (Signed)
 Chief Complaint: screening colonoscopy Primary GI MD: Dr. Obie  HPI: Discussed the use of AI scribe software for clinical note transcription with the patient, who gave verbal consent to proceed.  History of Present Illness   Kendra [name] is a 68 year old female who presents for a routine colonoscopy and evaluation of hemorrhoids.  She is here for her routine colonoscopy, which she undergoes every ten years. She was due for this procedure in November. During her last colonoscopy ten years ago, she was informed about the presence of a hemorrhoid.  She has had a hemorrhoid for 37 years, attributed to natural childbirth. The hemorrhoid does not cause significant pain or bleeding but is difficult to keep clean.  She walks one to two miles daily with her standard poodle, which she acquired after developing allergies to dogs with high shed rates. 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 She also has a history of cat allergies, requiring her to carry an inhaler for potential asthma attacks when exposed to cats.  No changes in bowel habits or bleeding. She describes herself as 'really, really healthy' and maintains an active lifestyle.   PREVIOUS GI WORKUP   Colonoscopy 12/2013 - Normal - Hemorrhoids - Repeat 10 years  Past Medical History:  Diagnosis Date   Anxiety    Arthritis    Asthma    Cataracts, bilateral    Depression    Diabetes type 2, controlled (HCC)    Hyperlipidemia    Seasonal allergies     Past Surgical History:  Procedure Laterality Date   BLEPHAROPLASTY  02/13/2007   CATARACT EXTRACTION, BILATERAL  2011, 2013   COLONOSCOPY  2015   COLONOSCOPY  2005   lumbar disc removal  02/12/1989   thumb surgery Right 2025    Current Outpatient Medications  Medication Sig Dispense Refill   albuterol (VENTOLIN HFA) 108 (90 Base) MCG/ACT inhaler Inhale into the lungs every 6 (six) hours as needed for  wheezing or shortness of breath.     ALPRAZolam (XANAX) 0.25 MG tablet TAKE 1 TABLET BY MOUTH EVERY DAY AT BEDTIME AS NEEDED FOR 30 DAYS     BIOTIN  PO Take by mouth.     levothyroxine (SYNTHROID) 50 MCG tablet Take 50 mcg by mouth daily.     Loratadine (CLARITIN) 10 MG CAPS Take 10 mg by mouth daily.     metFORMIN  (GLUCOPHAGE ) 500 MG tablet Take 500 mg by mouth daily.     Na Sulfate-K Sulfate-Mg Sulfate concentrate (SUPREP) 17.5-3.13-1.6 GM/177ML SOLN Take 1 kit (354 mLs total) by mouth once for 1 dose. 354 mL 0   Omega-3 Fatty Acids (OMEGA 3 PO) Omega 3     rosuvastatin  (CRESTOR ) 20 MG tablet Take 1 tablet (20 mg total) by mouth daily. PT. MUST MAKE AN APPOINTMENT IN ORDER TO RECEIVE FUTURE REFILLS, THIRD AND FINAL ATTEMPT. 7 tablet 0   sertraline  (ZOLOFT ) 25 MG tablet Take 25 mg by mouth daily.     No current facility-administered medications for this visit.    Allergies as of 01/27/2024   (No Known Allergies)    Family History  Problem Relation Age of Onset   Diabetes Mother    Heart disease Father    Diabetes Brother    Diabetes Maternal Grandmother    Breast cancer Paternal Grandmother    Crohn's disease Nephew    Colon cancer Neg Hx    Esophageal cancer Neg Hx  Rectal cancer Neg Hx    Prostate cancer Neg Hx     Social History   Socioeconomic History   Marital status: Single    Spouse name: Not on file   Number of children: 1   Years of education: Not on file   Highest education level: Not on file  Occupational History   Not on file  Tobacco Use   Smoking status: Former    Current packs/day: 0.00    Types: Cigarettes    Quit date: 02/12/1981    Years since quitting: 42.9   Smokeless tobacco: Never  Substance and Sexual Activity   Alcohol use: Yes    Alcohol/week: 0.0 standard drinks of alcohol    Comment: rare   Drug use: No   Sexual activity: Not on file  Other Topics Concern   Not on file  Social History Narrative   Not on file   Social Drivers of  Health   Tobacco Use: Medium Risk (01/27/2024)   Patient History    Smoking Tobacco Use: Former    Smokeless Tobacco Use: Never    Passive Exposure: Not on Actuary Strain: Not on file  Food Insecurity: Not on file  Transportation Needs: Not on file  Physical Activity: Not on file  Stress: Not on file  Social Connections: Not on file  Intimate Partner Violence: Not on file  Depression (PHQ2-9): Not on file  Alcohol Screen: Not on file  Housing: Not on file  Utilities: Not on file  Health Literacy: Not on file    Review of Systems:    Constitutional: No weight loss, fever, chills, weakness or fatigue HEENT: Eyes: No change in vision               Ears, Nose, Throat:  No change in hearing or congestion Skin: No rash or itching Cardiovascular: No chest pain, chest pressure or palpitations   Respiratory: No SOB or cough Gastrointestinal: See HPI and otherwise negative Genitourinary: No dysuria or change in urinary frequency Neurological: No headache, dizziness or syncope Musculoskeletal: No new muscle or joint pain Hematologic: No bleeding or bruising Psychiatric: No history of depression or anxiety    Physical Exam:  Vital signs: BP 130/66   Pulse 63   Ht 5' 5 (1.651 m)   Wt 140 lb 4 oz (63.6 kg)   BMI 23.34 kg/m   Constitutional: NAD, alert and cooperative Head:  Normocephalic and atraumatic. Eyes:   PEERL, EOMI. No icterus. Conjunctiva pink. Respiratory: Respirations even and unlabored. Lungs clear to auscultation bilaterally.   No wheezes, crackles, or rhonchi.  Cardiovascular:  Regular rate and rhythm. No peripheral edema, cyanosis or pallor.  Gastrointestinal:  Soft, nondistended, nontender. No rebound or guarding. Normal bowel sounds. No appreciable masses or hepatomegaly. Rectal:  Declines Msk:  Symmetrical without gross deformities. Without edema, no deformity or joint abnormality.  Neurologic:  Alert and  oriented x4;  grossly normal  neurologically.  Skin:   Dry and intact without significant lesions or rashes. Psychiatric: Oriented to person, place and time. Demonstrates good judgement and reason without abnormal affect or behaviors.  Physical Exam    RELEVANT LABS AND IMAGING: CBC    Component Value Date/Time   WBC 5.7 08/13/2017 2047   RBC 4.17 08/13/2017 2047   HGB 12.6 08/13/2017 2047   HCT 38.6 08/13/2017 2047   PLT 201 08/13/2017 2047   MCV 92.6 08/13/2017 2047   MCH 30.2 08/13/2017 2047   MCHC 32.6 08/13/2017 2047  RDW 13.2 08/13/2017 2047   LYMPHSABS 2.0 08/13/2017 2047   MONOABS 0.5 08/13/2017 2047   EOSABS 0.2 08/13/2017 2047   BASOSABS 0.0 08/13/2017 2047    CMP     Component Value Date/Time   NA 141 08/13/2017 2047   K 4.2 08/13/2017 2047   CL 105 08/13/2017 2047   CO2 26 08/13/2017 2047   GLUCOSE 105 (H) 08/13/2017 2047   BUN 10 08/13/2017 2047   CREATININE 0.66 08/13/2017 2047   CALCIUM  9.4 08/13/2017 2047   PROT 7.5 08/15/2017 0648   ALBUMIN 4.3 08/15/2017 0648   AST 28 08/15/2017 0648   ALT 31 08/15/2017 0648   ALKPHOS 68 08/15/2017 0648   BILITOT 0.5 08/15/2017 0648   GFRNONAA >60 08/13/2017 2047   GFRAA >60 08/13/2017 2047     Assessment/Plan:   Colon cancer screening Last colonoscopy 2015 with grade 1 internal hemorrhoids, otherwise normal.  Repeat 10 years. - Schedule colonoscopy - I thoroughly discussed the procedure with the patient (at bedside) to include nature of the procedure, alternatives, benefits, and risks (including but not limited to bleeding, infection, perforation, anesthesia/cardiac pulmonary complications).  Patient verbalized understanding and gave verbal consent to proceed with procedure.   Hemorrhoids Patient was told during this colonoscopy she will be a candidate for banding.  She is interested in possible hemorrhoid banding after this upcoming colonoscopy - Increase fiber, increase water, increase exercise - Sitz bath's as needed - Consider  hemorrhoid banding post colonoscopy  Assign to Dr. Stacia today  Nestor Blower, PA-C [facility] 01/27/2024, 2:29 PM  Cc: Lari Elspeth BRAVO, MD

## 2024-01-27 NOTE — Patient Instructions (Signed)
 You have been scheduled for a colonoscopy. Please follow written instructions given to you at your visit today.   If you use inhalers (even only as needed), please bring them with you on the day of your procedure.  DO NOT TAKE 7 DAYS PRIOR TO TEST- Trulicity (dulaglutide) Ozempic, Wegovy (semaglutide) Mounjaro, Zepbound (tirzepatide) Bydureon Bcise (exanatide extended release)  DO NOT TAKE 1 DAY PRIOR TO YOUR TEST Rybelsus (semaglutide) Adlyxin (lixisenatide) Victoza (liraglutide) Byetta (exanatide) ___________________________________________________________________________   Thank you for trusting me with your gastrointestinal care!   Bayley McMichael, PA-C  _______________________________________________________  If your blood pressure at your visit was 140/90 or greater, please contact your primary care physician to follow up on this.  _______________________________________________________  If you are age 60 or older, your body mass index should be between 23-30. Your Body mass index is 23.34 kg/m. If this is out of the aforementioned range listed, please consider follow up with your Primary Care Provider.  If you are age 40 or younger, your body mass index should be between 19-25. Your Body mass index is 23.34 kg/m. If this is out of the aformentioned range listed, please consider follow up with your Primary Care Provider.   ________________________________________________________  The [facility] providers would like to encourage you to use MYCHART to communicate with providers for non-urgent requests or questions.  Due to long hold times on the telephone, sending your provider a message by [facility] may be a faster and more efficient way to get a response.  Please allow 48 business hours for a response.  Please remember that this is for non-urgent requests.  _______________________________________________________  [facility] is using a team-based approach to  care.  Your team is made up of your doctor and two to three APPS. Our APPS (Nurse Practitioners and Physician Assistants) work with your physician to ensure care continuity for you. They are fully qualified to address your health concerns and develop a treatment plan. They communicate directly with your gastroenterologist to care for you. Seeing the Advanced Practice Practitioners on your physician's team can help you by facilitating care more promptly, often allowing for earlier appointments, access to diagnostic testing, procedures, and other specialty referrals.

## 2024-02-02 NOTE — Progress Notes (Signed)
 Agree with the assessment and plan as outlined by Boone Master, PA-C.

## 2024-02-26 ENCOUNTER — Ambulatory Visit: Admitting: Gastroenterology

## 2024-02-26 ENCOUNTER — Encounter: Payer: Self-pay | Admitting: Gastroenterology

## 2024-02-26 VITALS — BP 103/58 | HR 78 | Temp 98.2°F | Resp 16 | Ht 65.0 in | Wt 140.0 lb

## 2024-02-26 DIAGNOSIS — K642 Third degree hemorrhoids: Secondary | ICD-10-CM | POA: Diagnosis not present

## 2024-02-26 DIAGNOSIS — K6289 Other specified diseases of anus and rectum: Secondary | ICD-10-CM

## 2024-02-26 DIAGNOSIS — K562 Volvulus: Secondary | ICD-10-CM | POA: Diagnosis not present

## 2024-02-26 DIAGNOSIS — D123 Benign neoplasm of transverse colon: Secondary | ICD-10-CM

## 2024-02-26 DIAGNOSIS — D120 Benign neoplasm of cecum: Secondary | ICD-10-CM | POA: Diagnosis not present

## 2024-02-26 DIAGNOSIS — Z1211 Encounter for screening for malignant neoplasm of colon: Secondary | ICD-10-CM | POA: Diagnosis not present

## 2024-02-26 MED ORDER — SODIUM CHLORIDE 0.9 % IV SOLN: 500.0000 mL | Freq: Once | INTRAVENOUS | Status: DC

## 2024-02-26 NOTE — Progress Notes (Signed)
 A/o x 3, VSS, good SR's, pleased with anesthesia, report to RN

## 2024-02-26 NOTE — Progress Notes (Signed)
Updated medical record with pt

## 2024-02-26 NOTE — Patient Instructions (Addendum)
 Handouts provided on polyps, hemorrhoids and hemorrhoids banding.  Resume previous diet.  Continue present medications.  Await pathology results.  Repeat colonoscopy in 3 years for surveillance.  Ok to proceed with hemorrhoid banding in the office, if desired. See appointment details.   YOU HAD AN ENDOSCOPIC PROCEDURE TODAY AT THE [facility]:   Refer to the procedure report that was given to you for any specific questions about what was found during the examination.  If the procedure report does not answer your questions, please call your gastroenterologist to clarify.  If you requested that your care partner not be given the details of your procedure findings, then the procedure report has been included in a sealed envelope for you to review at your convenience later.  YOU SHOULD EXPECT: Some feelings of bloating in the abdomen. Passage of more gas than usual.  Walking can help get rid of the air that was put into your GI tract during the procedure and reduce the bloating. If you had a lower endoscopy (such as a colonoscopy or flexible sigmoidoscopy) you may notice spotting of blood in your stool or on the toilet paper. If you underwent a bowel prep for your procedure, you may not have a normal bowel movement for a few days.  Please Note:  You might notice some irritation and congestion in your nose or some drainage.  This is from the oxygen used during your procedure.  There is no need for concern and it should clear up in a day or so.  SYMPTOMS TO REPORT IMMEDIATELY:  Following lower endoscopy (colonoscopy or flexible sigmoidoscopy):  Excessive amounts of blood in the stool  Significant tenderness or worsening of abdominal pains  Swelling of the abdomen that is new, acute  Fever of 100F or higher  For urgent or emergent issues, a gastroenterologist can be reached at any hour by calling [phone]. Do not use MyChart messaging for urgent concerns.    DIET:  We do  recommend a small meal at first, but then you may proceed to your regular diet.  Drink plenty of fluids but you should avoid alcoholic beverages for 24 hours.  ACTIVITY:  You should plan to take it easy for the rest of today and you should NOT DRIVE or use heavy machinery until tomorrow (because of the sedation medicines used during the test).    FOLLOW UP: Our staff will call the number listed on your records the next business day following your procedure.  We will call around 7:15- 8:00 am to check on you and address any questions or concerns that you may have regarding the information given to you following your procedure. If we do not reach you, we will leave a message.     If any biopsies were taken you will be contacted by phone or by letter within the next 1-3 weeks.  Please call us  at [phone] if you have not heard about the biopsies in 3 weeks.    SIGNATURES/CONFIDENTIALITY: You and/or your care partner have signed paperwork which will be entered into your electronic medical record.  These signatures attest to the fact that that the information above on your After Visit Summary has been reviewed and is understood.  Full responsibility of the confidentiality of this discharge information lies with you and/or your care-partner.

## 2024-02-26 NOTE — Op Note (Signed)
 [facility] Patient Name: Kendra [name] Procedure Date: 02/26/2024 10:31 AM MRN: [id number] Endoscopist: Glendia E. Stacia , MD, [id number] Age: 69 Referring MD:  Date of Birth: 11-Sep-1955 Gender: Female Account #: [id number] Procedure:                Colonoscopy Indications:              Screening for colorectal malignant neoplasm (last                            colonoscopy was 10 years ago) Medicines:                Monitored Anesthesia Care Procedure:                Pre-Anesthesia Assessment:                           - Prior to the procedure, a History and Physical                            was performed, and patient medications and                            allergies were reviewed. The patient's tolerance of                            previous anesthesia was also reviewed. The risks                            and benefits of the procedure and the sedation                            options and risks were discussed with the patient.                            All questions were answered, and informed consent                            was obtained. Prior Anticoagulants: The patient has                            taken no anticoagulant or antiplatelet agents. ASA                            Grade Assessment: II - A patient with mild systemic                            disease. After reviewing the risks and benefits,                            the patient was deemed in satisfactory condition to                            undergo the procedure.  After obtaining informed consent, the colonoscope                            was passed under direct vision. Throughout the                            procedure, the patient's blood pressure, pulse, and                            oxygen saturations were monitored continuously. The                            Colonoscope was introduced through the anus and                            advanced to the the  terminal ileum, with                            identification of the appendiceal orifice and IC                            valve. The colonoscopy was somewhat difficult due                            to significant looping. Successful completion of                            the procedure was aided by using manual pressure.                            The patient tolerated the procedure well. The                            quality of the bowel preparation was good. The                            terminal ileum, ileocecal valve, appendiceal                            orifice, and rectum were photographed. The bowel                            preparation used was SUPREP via split dose                            instruction. Scope In: 10:52:53 AM Scope Out: 11:16:44 AM Scope Withdrawal Time: 0 hours 19 minutes 40 seconds  Total Procedure Duration: 0 hours 23 minutes 51 seconds  Findings:                 The perianal and digital rectal examinations were                            normal. Pertinent negatives include normal  sphincter tone and no palpable rectal lesions.                           A 7 mm polyp was found in the cecum. The polyp was                            sessile. The polyp was removed with a cold snare.                            Resection and retrieval were complete. Estimated                            blood loss was minimal.                           An 18 mm polyp was found in the transverse colon.                            The polyp was sessile. The polyp was removed with a                            piecemeal technique using a cold snare. Resection                            and retrieval were complete. Estimated blood loss                            was minimal.                           The exam was otherwise normal throughout the                            examined colon.                           The terminal ileum appeared normal.                            Non-bleeding internal hemorrhoids were found during                            retroflexion. The hemorrhoids were Grade III                            (internal hemorrhoids that prolapse but require                            manual reduction).                           Anal papilla(e) were hypertrophied.                           No additional abnormalities were found on  retroflexion. Complications:            No immediate complications. Estimated Blood Loss:     Estimated blood loss was minimal. Impression:               - One 7 mm polyp in the cecum, removed with a cold                            snare. Resected and retrieved.                           - One 18 mm polyp in the transverse colon, removed                            piecemeal using a cold snare. Resected and                            retrieved.                           - The examined portion of the ileum was normal.                           - Non-bleeding internal hemorrhoids.                           - Anal papilla(e) were hypertrophied. Recommendation:           - Patient has a contact number available for                            emergencies. The signs and symptoms of potential                            delayed complications were discussed with the                            patient. Return to normal activities tomorrow.                            Written discharge instructions were provided to the                            patient.                           - Resume previous diet.                           - Continue present medications.                           - Await pathology results.                           - Repeat colonoscopy in 3 years for surveillance.                           -  Ok to proceed with hemorrhoid banding in the                            office, if desired Latishia Suitt E. Stacia, MD 02/26/2024 11:21:55 AM This report has been signed  electronically.

## 2024-02-26 NOTE — Progress Notes (Signed)
 [facility] History and Physical   Primary Care Physician:  Lari Elspeth BRAVO, MD   Reason for Procedure:   Colon cancer screening  Plan:    Screening colonoscopy  HPI: Kendra [name] is a 69 y.o. female undergoing average risk screening colonoscopy.  She has no family history of colon cancer and no chronic GI symptoms.  She had a normal colonoscopy in 2015 and 2005.  She has bothersome hemorrhoid symptoms (prolapse, seepage, discomfort, occasional bleeding) but otherwise no chronic GI symptoms.   The patient was provided an opportunity to ask questions and all were answered. The patient agreed with the plan   Past Medical History:  Diagnosis Date   Anxiety    Arthritis    Asthma    if around cats   Cataracts, bilateral    Depression    Diabetes type 2, controlled (HCC)    Hyperlipidemia    Seasonal allergies     Past Surgical History:  Procedure Laterality Date   BACK SURGERY  2019   BLEPHAROPLASTY  02/13/2007   CATARACT EXTRACTION, BILATERAL  2011, 2013   CERVICAL SPINE SURGERY  2020   COLONOSCOPY  2015   COLONOSCOPY  2005   lumbar disc removal  02/12/1989   thumb surgery Right 2025    Prior to Admission medications  Medication Sig Start Date End Date Taking? Authorizing Provider  ALPRAZolam (XANAX) 0.25 MG tablet TAKE 1 TABLET BY MOUTH EVERY DAY AT BEDTIME AS NEEDED FOR 30 DAYS   Yes [provider]  BIOTIN  PO Take by mouth.   Yes [provider]  cetirizine (ZYRTEC) 10 MG chewable tablet Chew 10 mg by mouth daily.   Yes [provider]  clobetasol (TEMOVATE) 0.05 % external solution 2 (two) times daily as needed. 12/23/23  Yes [provider]  clotrimazole-betamethasone  (LOTRISONE) cream Apply topically 3 (three) times daily. 10/23/23  Yes [provider]  levothyroxine (SYNTHROID) 50 MCG tablet Take 50 mcg by mouth daily.   Yes [provider]  metFORMIN  (GLUCOPHAGE ) 500 MG tablet Take 500 mg by  mouth daily.   Yes [provider]  Omega-3 Fatty Acids (OMEGA 3 PO) Omega 3   Yes [provider]  rosuvastatin  (CRESTOR ) 20 MG tablet Take 1 tablet (20 mg total) by mouth daily. PT. MUST MAKE AN APPOINTMENT IN ORDER TO RECEIVE FUTURE REFILLS, THIRD AND FINAL ATTEMPT. 08/15/23  Yes Croitoru, Mihai, MD  sertraline  (ZOLOFT ) 25 MG tablet Take 25 mg by mouth daily.   Yes [provider]  tretinoin (RETIN-A) 0.1 % cream Apply topically at bedtime. 12/18/23  Yes [provider]  albuterol (VENTOLIN HFA) 108 (90 Base) MCG/ACT inhaler Inhale into the lungs every 6 (six) hours as needed for wheezing or shortness of breath.    [provider]    Current Outpatient Medications  Medication Sig Dispense Refill   ALPRAZolam (XANAX) 0.25 MG tablet TAKE 1 TABLET BY MOUTH EVERY DAY AT BEDTIME AS NEEDED FOR 30 DAYS     BIOTIN  PO Take by mouth.     cetirizine (ZYRTEC) 10 MG chewable tablet Chew 10 mg by mouth daily.     clobetasol (TEMOVATE) 0.05 % external solution 2 (two) times daily as needed.     clotrimazole-betamethasone  (LOTRISONE) cream Apply topically 3 (three) times daily.     levothyroxine (SYNTHROID) 50 MCG tablet Take 50 mcg by mouth daily.     metFORMIN  (GLUCOPHAGE ) 500 MG tablet Take 500 mg by mouth daily.  Omega-3 Fatty Acids (OMEGA 3 PO) Omega 3     rosuvastatin  (CRESTOR ) 20 MG tablet Take 1 tablet (20 mg total) by mouth daily. PT. MUST MAKE AN APPOINTMENT IN ORDER TO RECEIVE FUTURE REFILLS, THIRD AND FINAL ATTEMPT. 7 tablet 0   sertraline  (ZOLOFT ) 25 MG tablet Take 25 mg by mouth daily.     tretinoin (RETIN-A) 0.1 % cream Apply topically at bedtime.     albuterol (VENTOLIN HFA) 108 (90 Base) MCG/ACT inhaler Inhale into the lungs every 6 (six) hours as needed for wheezing or shortness of breath.     Current Facility-Administered Medications  Medication Dose Route Frequency Provider Last Rate Last Admin   0.9 %  sodium chloride  infusion  500 mL  Intravenous Once Stacia Glendia BRAVO, MD        Allergies as of 02/26/2024   (No Known Allergies)    Family History  Problem Relation Age of Onset   Diabetes Mother    Heart disease Father    Diabetes Brother    Diabetes Maternal Grandmother    Breast cancer Paternal Grandmother    Crohn's disease Nephew    Colon cancer Neg Hx    Esophageal cancer Neg Hx    Rectal cancer Neg Hx    Prostate cancer Neg Hx    Colon polyps Neg Hx    Stomach cancer Neg Hx     Social History   Socioeconomic History   Marital status: Single    Spouse name: Not on file   Number of children: 1   Years of education: Not on file   Highest education level: Not on file  Occupational History   Not on file  Tobacco Use   Smoking status: Former    Current packs/day: 0.00    Types: Cigarettes    Quit date: 02/12/1981    Years since quitting: 43.0   Smokeless tobacco: Never  Vaping Use   Vaping status: Never Used  Substance and Sexual Activity   Alcohol use: Yes    Alcohol/week: 2.0 standard drinks of alcohol    Types: 2 Glasses of wine per week    Comment: if out to dinner   Drug use: Never   Sexual activity: Yes    Birth control/protection: Post-menopausal  Other Topics Concern   Not on file  Social History Narrative   Not on file   Social Drivers of Health   Tobacco Use: Medium Risk (02/26/2024)   Patient History    Smoking Tobacco Use: Former    Smokeless Tobacco Use: Never    Passive Exposure: Not on Actuary Strain: Not on file  Food Insecurity: Not on file  Transportation Needs: Not on file  Physical Activity: Not on file  Stress: Not on file  Social Connections: Not on file  Intimate Partner Violence: Not on file  Depression (EYV7-0): Not on file  Alcohol Screen: Not on file  Housing: Not on file  Utilities: Not on file  Health Literacy: Not on file    Review of Systems:  All other review of systems negative except as mentioned in the HPI.  Physical  Exam: Vital signs BP 128/80   Pulse 66   Temp 98.2 F (36.8 C) (Temporal)   Ht 5' 5 (1.651 m)   Wt 140 lb (63.5 kg)   SpO2 97%   BMI 23.30 kg/m   General:   Alert,  Well-developed, well-nourished, pleasant and cooperative in NAD Airway:  Mallampati 2 Lungs:  Clear throughout  to auscultation.   Heart:  Regular rate and rhythm; no murmurs, clicks, rubs,  or gallops. Abdomen:  Soft, nontender and nondistended. Normal bowel sounds.   Neuro/Psych:  Normal mood and affect. A and O x 3   Eshan Trupiano E. Stacia, MD [facility]

## 2024-02-26 NOTE — Progress Notes (Signed)
 Called to room to assist during endoscopic procedure.  Patient ID and intended procedure confirmed with present staff. Received instructions for my participation in the procedure from the performing physician.

## 2024-02-27 ENCOUNTER — Telehealth: Payer: Self-pay | Admitting: *Deleted

## 2024-02-27 NOTE — Telephone Encounter (Signed)
" °  Follow up Call-     02/26/2024    9:28 AM  Call back number  Post procedure Call Back phone  # [phone]  Permission to leave phone message Yes     Patient questions:  Do you have a fever, pain , or abdominal swelling? No. Pain Score  0 *  Have you tolerated food without any problems? Yes.    Have you been able to return to your normal activities? Yes.    Do you have any questions about your discharge instructions: Diet   No. Medications  No. Follow up visit  No.  Do you have questions or concerns about your Care? No.  Actions: * If pain score is 4 or above: No action needed, pain <4.   "

## 2024-02-28 LAB — SURGICAL PATHOLOGY

## 2024-03-02 ENCOUNTER — Ambulatory Visit: Payer: Self-pay | Admitting: Gastroenterology

## 2024-03-02 NOTE — Progress Notes (Signed)
 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  Studies shows that at least 20% of women over age 71 and 30% of men over age 9 have pre-cancerous polyps.  Based on current nationally recognized surveillance guidelines, I recommend that you have a repeat colonoscopy in 3 years.   If you develop any new rectal bleeding, abdominal pain or significant bowel habit changes, please contact me before then.

## 2024-03-16 ENCOUNTER — Encounter: Admitting: Gastroenterology

## 2024-04-03 ENCOUNTER — Encounter: Admitting: Gastroenterology
